# Patient Record
Sex: Male | Born: 1950 | Race: White | Hispanic: No | Marital: Married | Smoking: Former smoker
Health system: Southern US, Community
[De-identification: ages and names within clinical notes are randomized; demographics above are authoritative.]

## PROBLEM LIST (undated history)

## (undated) DIAGNOSIS — C679 Malignant neoplasm of bladder, unspecified: Secondary | ICD-10-CM

---

## 2013-06-13 DIAGNOSIS — K5100 Ulcerative (chronic) pancolitis without complications: Secondary | ICD-10-CM | POA: Diagnosis present

## 2018-11-02 DIAGNOSIS — C672 Malignant neoplasm of lateral wall of bladder: Secondary | ICD-10-CM

## 2018-11-29 HISTORY — PX: TRANSURETHRAL RESECTION OF BLADDER TUMOR: SHX2575

## 2018-12-29 ENCOUNTER — Other Ambulatory Visit: Payer: Self-pay

## 2018-12-29 ENCOUNTER — Inpatient Hospital Stay (HOSPITAL_COMMUNITY)
Admission: EM | Admit: 2018-12-29 | Discharge: 2019-01-01 | DRG: 683 | Disposition: A | Discharge status: Home / Self Care | Payer: Medicare Other | Attending: Internal Medicine | Admitting: Internal Medicine

## 2018-12-29 ENCOUNTER — Emergency Department (HOSPITAL_COMMUNITY): Payer: Medicare Other

## 2018-12-29 ENCOUNTER — Encounter (HOSPITAL_COMMUNITY): Payer: Self-pay | Admitting: Emergency Medicine

## 2018-12-29 DIAGNOSIS — N200 Calculus of kidney: Secondary | ICD-10-CM | POA: Diagnosis present

## 2018-12-29 DIAGNOSIS — N390 Urinary tract infection, site not specified: Secondary | ICD-10-CM | POA: Diagnosis present

## 2018-12-29 DIAGNOSIS — N179 Acute kidney failure, unspecified: Principal | ICD-10-CM | POA: Diagnosis present

## 2018-12-29 DIAGNOSIS — Z880 Allergy status to penicillin: Secondary | ICD-10-CM

## 2018-12-29 DIAGNOSIS — Z8371 Family history of colonic polyps: Secondary | ICD-10-CM

## 2018-12-29 DIAGNOSIS — K5100 Ulcerative (chronic) pancolitis without complications: Secondary | ICD-10-CM | POA: Diagnosis present

## 2018-12-29 DIAGNOSIS — R109 Unspecified abdominal pain: Secondary | ICD-10-CM | POA: Diagnosis not present

## 2018-12-29 DIAGNOSIS — Z20828 Contact with and (suspected) exposure to other viral communicable diseases: Secondary | ICD-10-CM | POA: Diagnosis present

## 2018-12-29 DIAGNOSIS — K219 Gastro-esophageal reflux disease without esophagitis: Secondary | ICD-10-CM | POA: Diagnosis present

## 2018-12-29 DIAGNOSIS — R339 Retention of urine, unspecified: Secondary | ICD-10-CM

## 2018-12-29 DIAGNOSIS — N401 Enlarged prostate with lower urinary tract symptoms: Secondary | ICD-10-CM | POA: Diagnosis present

## 2018-12-29 DIAGNOSIS — K8020 Calculus of gallbladder without cholecystitis without obstruction: Secondary | ICD-10-CM | POA: Diagnosis present

## 2018-12-29 DIAGNOSIS — Z87891 Personal history of nicotine dependence: Secondary | ICD-10-CM

## 2018-12-29 DIAGNOSIS — C672 Malignant neoplasm of lateral wall of bladder: Secondary | ICD-10-CM | POA: Diagnosis not present

## 2018-12-29 DIAGNOSIS — N138 Other obstructive and reflux uropathy: Secondary | ICD-10-CM | POA: Diagnosis present

## 2018-12-29 DIAGNOSIS — R338 Other retention of urine: Secondary | ICD-10-CM

## 2018-12-29 HISTORY — DX: Malignant neoplasm of bladder, unspecified: C67.9

## 2018-12-29 LAB — COMPREHENSIVE METABOLIC PANEL
ALT: 34 U/L (ref 0–44)
AST: 24 U/L (ref 15–41)
Albumin: 3.1 g/dL — ABNORMAL LOW (ref 3.5–5.0)
Alkaline Phosphatase: 62 U/L (ref 38–126)
Anion gap: 16 — ABNORMAL HIGH (ref 5–15)
BUN: 24 mg/dL — ABNORMAL HIGH (ref 8–23)
CO2: 21 mmol/L — ABNORMAL LOW (ref 22–32)
Calcium: 8.5 mg/dL — ABNORMAL LOW (ref 8.9–10.3)
Chloride: 93 mmol/L — ABNORMAL LOW (ref 98–111)
Creatinine, Ser: 2.99 mg/dL — ABNORMAL HIGH (ref 0.61–1.24)
GFR calc Af Amer: 24 mL/min — ABNORMAL LOW (ref >60)
GFR calc non Af Amer: 20 mL/min — ABNORMAL LOW (ref >60)
Glucose, Bld: 134 mg/dL — ABNORMAL HIGH (ref 70–99)
Potassium: 3.5 mmol/L (ref 3.5–5.1)
Sodium: 130 mmol/L — ABNORMAL LOW (ref 135–145)
Total Bilirubin: 0.7 mg/dL (ref 0.3–1.2)
Total Protein: 7.3 g/dL (ref 6.5–8.1)

## 2018-12-29 LAB — CBC
HCT: 37.3 % — ABNORMAL LOW (ref 39.0–52.0)
Hemoglobin: 12.7 g/dL — ABNORMAL LOW (ref 13.0–17.0)
MCH: 30.5 pg (ref 26.0–34.0)
MCHC: 34 g/dL (ref 30.0–36.0)
MCV: 89.7 fL (ref 80.0–100.0)
Platelets: 324 10*3/uL (ref 150–400)
RBC: 4.16 MIL/uL — ABNORMAL LOW (ref 4.22–5.81)
RDW: 12.9 % (ref 11.5–15.5)
WBC: 19.2 10*3/uL — ABNORMAL HIGH (ref 4.0–10.5)
nRBC: 0 % (ref 0.0–0.2)

## 2018-12-29 LAB — URINALYSIS, ROUTINE W REFLEX MICROSCOPIC
Bilirubin Urine: NEGATIVE
Glucose, UA: NEGATIVE mg/dL
Ketones, ur: NEGATIVE mg/dL
Nitrite: NEGATIVE
Protein, ur: NEGATIVE mg/dL
Specific Gravity, Urine: 1.005 (ref 1.005–1.030)
pH: 5 (ref 5.0–8.0)

## 2018-12-29 LAB — LACTIC ACID, PLASMA
Lactic Acid, Venous: 1.3 mmol/L (ref 0.5–1.9)
Lactic Acid, Venous: 1.1 mmol/L (ref 0.5–1.9)

## 2018-12-29 LAB — SARS CORONAVIRUS 2 BY RT PCR (HOSPITAL ORDER, PERFORMED IN ~~LOC~~ HOSPITAL LAB): SARS Coronavirus 2: NEGATIVE

## 2018-12-29 LAB — LIPASE, BLOOD: Lipase: 39 U/L (ref 11–51)

## 2018-12-29 MED ORDER — ONDANSETRON HCL 4 MG/2ML IJ SOLN: 4.0000 mg | Freq: Four times a day (QID) | INTRAMUSCULAR | Status: DC | PRN

## 2018-12-29 MED ORDER — FENTANYL CITRATE (PF) 100 MCG/2ML IJ SOLN
50.0000 ug | Freq: Once | INTRAMUSCULAR | Status: AC
  Administered 2018-12-29: 50 ug via INTRAVENOUS
  Filled 2018-12-29: qty 2

## 2018-12-29 MED ORDER — ACETAMINOPHEN 650 MG RE SUPP: 650.0000 mg | Freq: Four times a day (QID) | RECTAL | Status: DC | PRN

## 2018-12-29 MED ORDER — SODIUM CHLORIDE 0.9 % IV BOLUS
500.0000 mL | Freq: Once | INTRAVENOUS | Status: AC
  Administered 2018-12-29: 500 mL via INTRAVENOUS

## 2018-12-29 MED ORDER — TAMSULOSIN HCL 0.4 MG PO CAPS
0.8000 mg | ORAL_CAPSULE | Freq: Every day | ORAL | Status: DC
  Administered 2018-12-29 – 2019-01-01 (×4): 0.8 mg via ORAL
  Filled 2018-12-29 (×4): qty 2

## 2018-12-29 MED ORDER — SODIUM CHLORIDE 0.9% FLUSH: 3.0000 mL | Freq: Once | INTRAVENOUS | Status: DC

## 2018-12-29 MED ORDER — ONDANSETRON HCL 4 MG/2ML IJ SOLN
4.0000 mg | Freq: Once | INTRAMUSCULAR | Status: AC
  Administered 2018-12-29: 4 mg via INTRAVENOUS
  Filled 2018-12-29: qty 2

## 2018-12-29 MED ORDER — ACETAMINOPHEN 325 MG PO TABS
650.0000 mg | ORAL_TABLET | Freq: Four times a day (QID) | ORAL | Status: DC | PRN
  Administered 2018-12-30: 06:00:00 650 mg via ORAL
  Filled 2018-12-29: qty 2

## 2018-12-29 MED ORDER — ONDANSETRON HCL 4 MG PO TABS: 4.0000 mg | ORAL_TABLET | Freq: Four times a day (QID) | ORAL | Status: DC | PRN

## 2018-12-29 MED ORDER — SULFAMETHOXAZOLE-TRIMETHOPRIM 400-80 MG PO TABS
1.0000 | ORAL_TABLET | Freq: Two times a day (BID) | ORAL | Status: DC
  Administered 2018-12-30: 1 via ORAL
  Filled 2018-12-29 (×3): qty 1

## 2018-12-29 MED ORDER — HEPARIN SODIUM (PORCINE) 5000 UNIT/ML IJ SOLN
5000.0000 [IU] | Freq: Three times a day (TID) | INTRAMUSCULAR | Status: DC
  Administered 2018-12-29 – 2018-12-31 (×6): 5000 [IU] via SUBCUTANEOUS
  Filled 2018-12-29 (×8): qty 1

## 2018-12-29 NOTE — ED Provider Notes (Signed)
[facility] EMERGENCY DEPARTMENT Provider Note   CSN: [id number] Arrival date & time: 12/29/18  1021    History   Chief Complaint Chief Complaint  Patient presents with  . Abdominal Pain    HPI Timothy Fitzpatrick is a 68 y.o. male.     Patient is a 68 year old male with a history of ulcerative colitis and recent diagnosis of bladder cancer.  He is currently on Valstar and is followed by Dr. Rosana Hoes with [facility] urology/oncology at the office here in [place].  He states over the last week he has had worsening pain to his abdomen.  He has had some difficulty getting his urine out and at times it only dribbles.  He has had some increasing lower abdominal pain.  He was recently treated for a UTI and currently is still on Bactrim.  He reports he has not been able to eat or drink much and has had several episodes of vomiting.  He reports diffuse abdominal pain although it is mostly in the lower region.  He has had some fevers up to 103.  He has had poor appetite and poor oral intake.  He has had some diarrhea although he took Lomotil and then became a little bit constipated.  He denies any blood in his urine or stool.     Past Medical History:  Diagnosis Date  . Bladder cancer ([place])     There are no active problems to display for this patient.   History reviewed. No pertinent surgical history.      Home Medications    Prior to Admission medications   Not on File    Family History No family history on file.  Social History Social History   Tobacco Use  . Smoking status: Former Smoker  Substance Use Topics  . Alcohol use: Never    Frequency: Never  . Drug use: Never     Allergies   Penicillins   Review of Systems Review of Systems  Constitutional: Positive for appetite change, fatigue and fever. Negative for chills and diaphoresis.  HENT: Negative for congestion, rhinorrhea and sneezing.   Eyes: Negative.   Respiratory: Negative for cough,  chest tightness and shortness of breath.   Cardiovascular: Negative for chest pain and leg swelling.  Gastrointestinal: Positive for abdominal pain, constipation, diarrhea, nausea and vomiting. Negative for blood in stool.  Genitourinary: Positive for difficulty urinating. Negative for flank pain, frequency and hematuria.  Musculoskeletal: Negative for arthralgias and back pain.  Skin: Negative for rash.  Neurological: Negative for dizziness, speech difficulty, weakness, numbness and headaches.     Physical Exam Updated Vital Signs BP 125/72   Pulse 74   Temp 98.5 F (36.9 C) (Oral)   Resp 20   Ht 5\' 11"  (1.803 m)   Wt 93 kg   SpO2 96%   BMI 28.59 kg/m   Physical Exam Constitutional:      Appearance: He is well-developed.  HENT:     Head: Normocephalic and atraumatic.  Eyes:     Pupils: Pupils are equal, round, and reactive to light.  Neck:     Musculoskeletal: Normal range of motion and neck supple.  Cardiovascular:     Rate and Rhythm: Normal rate and regular rhythm.     Heart sounds: Normal heart sounds.  Pulmonary:     Effort: Pulmonary effort is normal. No respiratory distress.     Breath sounds: Normal breath sounds. No wheezing or rales.  Chest:  Chest wall: No tenderness.  Abdominal:     General: Bowel sounds are normal.     Palpations: Abdomen is soft.     Tenderness: There is generalized abdominal tenderness (Patient has generalized tenderness although it is more pronounced across the lower abdomen). There is no guarding or rebound.  Genitourinary:    Comments: Normal external male genitalia.  No testicular tenderness. Musculoskeletal: Normal range of motion.  Lymphadenopathy:     Cervical: No cervical adenopathy.  Skin:    General: Skin is warm and dry.     Findings: No rash.  Neurological:     Mental Status: He is alert and oriented to person, place, and time.      ED Treatments / Results  Labs (all labs ordered are listed, but only abnormal  results are displayed) Labs Reviewed  COMPREHENSIVE METABOLIC PANEL - Abnormal; Notable for the following components:      Result Value   Sodium 130 (*)    Chloride 93 (*)    CO2 21 (*)    Glucose, Bld 134 (*)    BUN 24 (*)    Creatinine, Ser 2.99 (*)    Calcium 8.5 (*)    Albumin 3.1 (*)    GFR calc non Af Amer 20 (*)    GFR calc Af Amer 24 (*)    Anion gap 16 (*)    All other components within normal limits  CBC - Abnormal; Notable for the following components:   WBC 19.2 (*)    RBC 4.16 (*)    Hemoglobin 12.7 (*)    HCT 37.3 (*)    All other components within normal limits  URINALYSIS, ROUTINE W REFLEX MICROSCOPIC - Abnormal; Notable for the following components:   Color, Urine STRAW (*)    Hgb urine dipstick MODERATE (*)    Leukocytes,Ua TRACE (*)    Bacteria, UA RARE (*)    All other components within normal limits  SARS CORONAVIRUS 2 (HOSPITAL ORDER, [facility])  URINE CULTURE  CULTURE, BLOOD (ROUTINE X 2)  CULTURE, BLOOD (ROUTINE X 2)  LIPASE, BLOOD  LACTIC ACID, PLASMA  LACTIC ACID, PLASMA    EKG None  Radiology Ct Abdomen Pelvis Wo Contrast  Result Date: 12/29/2018 CLINICAL DATA:  Generalized abdominal pain, currently receiving treatment for bladder cancer. Worsening nausea, vomiting and diarrhea. EXAM: CT ABDOMEN AND PELVIS WITHOUT CONTRAST TECHNIQUE: Multidetector CT imaging of the abdomen and pelvis was performed following the standard protocol without IV contrast. COMPARISON:  CT July 29, 2018 FINDINGS: Lower chest: Bandlike areas of scarring and/or atelectasis. Lung bases are otherwise clear. Normal heart size. No pericardial effusion. Hepatobiliary: No focal liver abnormality is seen. Stable appearance of the 1 cm gallstone near the gallbladder neck. No gallbladder wall thickening or biliary dilatation. Pancreas: Unremarkable. No pancreatic ductal dilatation or surrounding inflammatory changes. Spleen: Normal in size without focal  abnormality. Adrenals/Urinary Tract: Normal adrenals. Asymmetrically enlarged appearance of the right kidney with increased right perinephric stranding. Several nonobstructing calculi are present in the right renal collecting system. No ureteral calculi. The left kidney has a more normal appearance. Left ureter is unremarkable. The circumferentially thickened urinary bladder is largely decompressed over an inflated Foley catheter balloon with foci of gas within the lumen likely related to this instrumentation. Stomach/Bowel: Distal esophagus, stomach and duodenal sweep are unremarkable. No bowel wall thickening or dilatation. No evidence of obstruction. A normal appendix is visualized. Vascular/Lymphatic: Atherosclerotic plaque within the normal caliber aorta. Greater  than expected number of upper mid mesenteric lymph nodes in a surrounding area of hazy mesenteric stranding compatible with a ?misty mesentery? similar to prior. No suspicious or enlarged lymph nodes in the included lymphatic chains. Reproductive: Mild prostatomegaly. Coarse eccentric calcification of the prostate. No concerning abnormalities of the prostate or seminal vesicles. Other: No abdominopelvic free fluid or free gas. No bowel containing hernias. Bilateral fat containing inguinal hernias. Musculoskeletal: No acute or significant osseous findings. Multilevel degenerative changes are present in the imaged portions of the spine. IMPRESSION: 1. Asymmetrically enlarged appearance of the right kidney with increased right perinephric stranding, concerning for pyelonephritis. Recommend correlation with urinalysis. 2. Several nonobstructing calculi in the right renal collecting system. 3. Circumferentially thickened bladder wall decompressed by Foley catheter. Thickening may be treatment related the exclusion of urinary tract infection is recommended. 4. Greater than expected number of upper mid mesenteric lymph nodes in a surrounding area of hazy  mesenteric stranding compatible with mesenteritis, similar to prior. 5. Cholelithiasis without evidence of acute cholecystitis. 6. Mild prostatomegaly. 7. Aortic Atherosclerosis (ICD10-I70.0). Electronically Signed   By: Lovena Le M.D.   On: 12/29/2018 18:03    Procedures Procedures (including critical care time)  Medications Ordered in ED Medications  sodium chloride flush (NS) 0.9 % injection 3 mL (0 mLs Intravenous Hold 12/29/18 1444)  sodium chloride 0.9 % bolus 500 mL (0 mLs Intravenous Stopped 12/29/18 1300)  fentaNYL (SUBLIMAZE) injection 50 mcg (50 mcg Intravenous Given 12/29/18 1231)  ondansetron (ZOFRAN) injection 4 mg (4 mg Intravenous Given 12/29/18 1231)  sodium chloride 0.9 % bolus 500 mL (0 mLs Intravenous Stopped 12/29/18 1430)  fentaNYL (SUBLIMAZE) injection 50 mcg (50 mcg Intravenous Given 12/29/18 1546)     Initial Impression / Assessment and Plan / ED Course  I have reviewed the triage vital signs and the nursing notes.  Pertinent labs & imaging results that were available during my care of the patient were reviewed by me and considered in my medical decision making (see chart for details).        Patient is a 68 year old male who presents with abdominal pain associated fever and vomiting.  On bladder scan he had 750 cc.  A Foley catheter was placed and got a liter and a half drained out.  His abdominal pain improved but given his elevated white count, fever and ongoing pain, CT scan was performed which shows some bladder thickening and also some stranding around the right kidney.  His urine does not really appear to be consistent with infection.  It was sent with for culture.  He does have evidence of acute kidney injury on labs with a creatinine of 2.99.  He had a previously normal creatinine in June.  He was given some IV fluids.  There is no suggestions of sepsis.  I spoke to Dr. Posey Pronto with the hospitalist service to admit the patient for unassigned medicine.  Final  Clinical Impressions(s) / ED Diagnoses   Final diagnoses:  AKI (acute kidney injury) [facility])  Urinary retention    ED Discharge Orders    None       Malvin Johns, MD 12/29/18 [phone]

## 2018-12-29 NOTE — H&P (Signed)
History and Physical    Timothy Fitzpatrick PZW:[id number] DOB: Sep 30, 1950 DOA: 12/29/2018  PCP: [name]., MD  Patient coming from: Home  I have personally briefly reviewed patient's old medical records in [place]  Chief Complaint: Abdominal distention and pain, decreased urine output  HPI: Timothy Fitzpatrick is a 68 y.o. male with medical history significant for bladder cancer, ulcerative pancolitis, and GERD who presents to the ED for evaluation of 1 day decreased urinary output, abdominal bloating and discomfort, intermittent fevers, and decreased oral intake.  Patient was recently found to have Enterobacter aerogenes UTI and started on a 7-day course of Bactrim by his urologist which he began on 12/26/2018.  He was feeling well until the above symptoms began 1 day prior to admission.  He states he has been taking Flomax with adequate urine output until the last 24 hours when he is just had dribbling.  He has not had burning or pain with urination.  ED Course:  Initial vitals showed BP 167/80, pulse 25, RR 20, temp 98.5 Fahrenheit, SPO2 96% on room air.  Labs notable for WBC 19.2, hemoglobin 12.7, platelets 324,000, sodium 130, potassium 3.5, bicarb 21, BUN 24, creatinine 2.99, lipase 39, lactic acid 1.3.  Urinalysis showed negative nitrites, trace leukocytes, 0-5 RBC/hpf, 0-5 WBC/hpf, rare bacteria on microscopy.  Urine culture was obtained and pending.  Blood cultures were also obtained and pending.  SARS-CoV-2 test is negative.  Foley catheter was placed with reported output of 1.5 L and significant symptomatic improvement.  CT abdomen/pelvis without contrast showed an asymmetrically enlarged appearance of the right kidney with increased right perinephric stranding, several nonobstructing calculi in the right renal collecting system, thickened bladder wall decompressed by Foley catheter, cholelithiasis without evidence of acute cholecystitis, and mild prostatomegaly.  Patient was  given 500 cc normal saline x2 and the hospitalist service was consulted for further evaluation and management.   Review of Systems: All systems reviewed and are negative except as documented in history of present illness above.   Past Medical History:  Diagnosis Date   Bladder cancer [facility])     Past Surgical History:  Procedure Laterality Date   TRANSURETHRAL RESECTION OF BLADDER TUMOR  11/29/2018    Social History:  reports that he has quit smoking. He does not have any smokeless tobacco history on file. He reports that he does not drink alcohol or use drugs.  Allergies  Allergen Reactions   Penicillins     Family History  Problem Relation Age of Onset   Colon polyps Mother      Prior to Admission medications   Not on File    Physical Exam: Vitals:   12/29/18 1700 12/29/18 1715 12/29/18 1730 12/29/18 1818  BP: 111/69 113/66 125/72   Pulse: 67 71 74   Resp:      Temp:      TempSrc:      SpO2: 96% 96% 96%   Weight:    93 kg  Height:    5\' 11"  (1.803 m)    Constitutional: Resting in bed, NAD, calm, comfortable Eyes: PERRL, lids and conjunctivae normal ENMT: Mucous membranes are moist. Posterior pharynx clear of any exudate or lesions.Normal dentition.  Neck: normal, supple, no masses. Respiratory: clear to auscultation bilaterally, no wheezing, no crackles. Normal respiratory effort. No accessory muscle use.  Cardiovascular: Regular rate and rhythm, no murmurs / rubs / gallops. No extremity edema. 2+ pedal pulses. Abdomen: no tenderness, no masses palpated. No hepatosplenomegaly. Bowel sounds positive.  GU: Foley catheter in place Musculoskeletal: no clubbing / cyanosis. No joint deformity upper and lower extremities. Good ROM, no contractures. Normal muscle tone.  Skin: no rashes, lesions, ulcers. No induration Neurologic: CN 2-12 grossly intact. Sensation intact, Strength 5/5 in all 4.  Psychiatric: Normal judgment and insight. Alert and oriented x 3.  Normal mood.     Labs on Admission: I have personally reviewed following labs and imaging studies  CBC: Recent Labs  Lab 12/29/18 1053  WBC 19.2*  HGB 12.7*  HCT 37.3*  MCV 89.7  PLT 604   Basic Metabolic Panel: Recent Labs  Lab 12/29/18 1053  NA 130*  K 3.5  CL 93*  CO2 21*  GLUCOSE 134*  BUN 24*  CREATININE 2.99*  CALCIUM 8.5*   GFR: Estimated Creatinine Clearance: 27.2 mL/min (A) (by C-G formula based on SCr of 2.99 mg/dL (H)). Liver Function Tests: Recent Labs  Lab 12/29/18 1053  AST 24  ALT 34  ALKPHOS 62  BILITOT 0.7  PROT 7.3  ALBUMIN 3.1*   Recent Labs  Lab 12/29/18 1053  LIPASE 39   No results for input(s): AMMONIA in the last 168 hours. Coagulation Profile: No results for input(s): INR, PROTIME in the last 168 hours. Cardiac Enzymes: No results for input(s): CKTOTAL, CKMB, CKMBINDEX, TROPONINI in the last 168 hours. BNP (last 3 results) No results for input(s): PROBNP in the last 8760 hours. HbA1C: No results for input(s): HGBA1C in the last 72 hours. CBG: No results for input(s): GLUCAP in the last 168 hours. Lipid Profile: No results for input(s): CHOL, HDL, LDLCALC, TRIG, CHOLHDL, LDLDIRECT in the last 72 hours. Thyroid Function Tests: No results for input(s): TSH, T4TOTAL, FREET4, T3FREE, THYROIDAB in the last 72 hours. Anemia Panel: No results for input(s): VITAMINB12, FOLATE, FERRITIN, TIBC, IRON, RETICCTPCT in the last 72 hours. Urine analysis:    Component Value Date/Time   COLORURINE STRAW (A) 12/29/2018 1237   APPEARANCEUR CLEAR 12/29/2018 1237   LABSPEC 1.005 12/29/2018 1237   PHURINE 5.0 12/29/2018 1237   GLUCOSEU NEGATIVE 12/29/2018 1237   HGBUR MODERATE (A) 12/29/2018 1237   BILIRUBINUR NEGATIVE 12/29/2018 1237   KETONESUR NEGATIVE 12/29/2018 1237   PROTEINUR NEGATIVE 12/29/2018 1237   NITRITE NEGATIVE 12/29/2018 1237   LEUKOCYTESUR TRACE (A) 12/29/2018 1237    Radiological Exams on Admission: Ct Abdomen Pelvis  Wo Contrast  Result Date: 12/29/2018 CLINICAL DATA:  Generalized abdominal pain, currently receiving treatment for bladder cancer. Worsening nausea, vomiting and diarrhea. EXAM: CT ABDOMEN AND PELVIS WITHOUT CONTRAST TECHNIQUE: Multidetector CT imaging of the abdomen and pelvis was performed following the standard protocol without IV contrast. COMPARISON:  CT July 29, 2018 FINDINGS: Lower chest: Bandlike areas of scarring and/or atelectasis. Lung bases are otherwise clear. Normal heart size. No pericardial effusion. Hepatobiliary: No focal liver abnormality is seen. Stable appearance of the 1 cm gallstone near the gallbladder neck. No gallbladder wall thickening or biliary dilatation. Pancreas: Unremarkable. No pancreatic ductal dilatation or surrounding inflammatory changes. Spleen: Normal in size without focal abnormality. Adrenals/Urinary Tract: Normal adrenals. Asymmetrically enlarged appearance of the right kidney with increased right perinephric stranding. Several nonobstructing calculi are present in the right renal collecting system. No ureteral calculi. The left kidney has a more normal appearance. Left ureter is unremarkable. The circumferentially thickened urinary bladder is largely decompressed over an inflated Foley catheter balloon with foci of gas within the lumen likely related to this instrumentation. Stomach/Bowel: Distal esophagus, stomach and duodenal sweep are unremarkable. No bowel wall  thickening or dilatation. No evidence of obstruction. A normal appendix is visualized. Vascular/Lymphatic: Atherosclerotic plaque within the normal caliber aorta. Greater than expected number of upper mid mesenteric lymph nodes in a surrounding area of hazy mesenteric stranding compatible with a ?misty mesentery? similar to prior. No suspicious or enlarged lymph nodes in the included lymphatic chains. Reproductive: Mild prostatomegaly. Coarse eccentric calcification of the prostate. No concerning abnormalities  of the prostate or seminal vesicles. Other: No abdominopelvic free fluid or free gas. No bowel containing hernias. Bilateral fat containing inguinal hernias. Musculoskeletal: No acute or significant osseous findings. Multilevel degenerative changes are present in the imaged portions of the spine. IMPRESSION: 1. Asymmetrically enlarged appearance of the right kidney with increased right perinephric stranding, concerning for pyelonephritis. Recommend correlation with urinalysis. 2. Several nonobstructing calculi in the right renal collecting system. 3. Circumferentially thickened bladder wall decompressed by Foley catheter. Thickening may be treatment related the exclusion of urinary tract infection is recommended. 4. Greater than expected number of upper mid mesenteric lymph nodes in a surrounding area of hazy mesenteric stranding compatible with mesenteritis, similar to prior. 5. Cholelithiasis without evidence of acute cholecystitis. 6. Mild prostatomegaly. 7. Aortic Atherosclerosis (ICD10-I70.0). Electronically Signed   By: Lovena Le M.D.   On: 12/29/2018 18:03    EKG: Not performed.  Assessment/Plan Principal Problem:   Malignant neoplasm of lateral wall of urinary bladder (HCC) Active Problems:   AKI (acute kidney injury) ([place])   Ulcerative pancolitis (HCC)   Acute urinary retention  Timothy Fitzpatrick is a 68 y.o. male with medical history significant for bladder cancer, ulcerative pancolitis, and GERD who is admitted with AKI due to acute urinary retention.   Acute kidney injury due to acute urinary retention: Foley catheter placed in the ED with approximately 1.5 L output and significant improvement in symptoms.  Urinary retention possibly secondary to valrubicin therapy. -Continue Foley catheter, may need to keep in place on discharge and follow-up with urology -Increase tamsulosin to 0.4 mg daily -Repeat labs in a.m.  Enterobacter aerogenous UTI: Seen on urine culture collected  12/23/2018 at outside facility.  Has been on Bactrim beginning a 12/26/2018 which culture shows susceptibility to. -Continue oral Bactrim DS through 01/02/2019 -Follow-up repeat urine culture  Bladder cancer: Follows with urology in [place].  Underwent transurethral resection of bladder tumor on 11/29/2018.  Currently receiving valrubicin weekly infusions.  -Follow-up with urology outpatient  Ulcerative pancolitis: Follows with GI in [place].  Has been on infliximab infusions, however patient states this is on hold due to planned BCG treatment for bladder cancer.  Currently no evidence of acute flare.  Continue to monitor.   DVT prophylaxis: Subcutaneous heparin Code Status: Full code, confirmed with patient Family Communication: Discussed with patient, no family at bedside Disposition Plan: Likely discharge to home in 1-2 days Consults called: None Admission status: Observation   Zada Finders MD [facility]  If 7PM-7AM, please contact night-coverage [email or web address]  12/29/2018, 7:33 PM

## 2018-12-29 NOTE — ED Notes (Signed)
Patient transported to CT 

## 2018-12-29 NOTE — ED Triage Notes (Signed)
Pt is currently being treated for bladder cancer and for the last week and been dealing with abd pain n/v/d pt is currently on antibiotics for a UTI as well. Pt states he keeps feeling worse instead of better.

## 2018-12-30 ENCOUNTER — Encounter (HOSPITAL_COMMUNITY): Payer: Self-pay

## 2018-12-30 DIAGNOSIS — Z87891 Personal history of nicotine dependence: Secondary | ICD-10-CM | POA: Diagnosis not present

## 2018-12-30 DIAGNOSIS — N179 Acute kidney failure, unspecified: Secondary | ICD-10-CM | POA: Diagnosis present

## 2018-12-30 DIAGNOSIS — N390 Urinary tract infection, site not specified: Secondary | ICD-10-CM | POA: Diagnosis present

## 2018-12-30 DIAGNOSIS — N138 Other obstructive and reflux uropathy: Secondary | ICD-10-CM | POA: Diagnosis present

## 2018-12-30 DIAGNOSIS — K219 Gastro-esophageal reflux disease without esophagitis: Secondary | ICD-10-CM | POA: Diagnosis present

## 2018-12-30 DIAGNOSIS — Z20828 Contact with and (suspected) exposure to other viral communicable diseases: Secondary | ICD-10-CM | POA: Diagnosis present

## 2018-12-30 DIAGNOSIS — Z880 Allergy status to penicillin: Secondary | ICD-10-CM | POA: Diagnosis not present

## 2018-12-30 DIAGNOSIS — C672 Malignant neoplasm of lateral wall of bladder: Secondary | ICD-10-CM | POA: Diagnosis present

## 2018-12-30 DIAGNOSIS — R109 Unspecified abdominal pain: Secondary | ICD-10-CM | POA: Diagnosis present

## 2018-12-30 DIAGNOSIS — N401 Enlarged prostate with lower urinary tract symptoms: Secondary | ICD-10-CM | POA: Diagnosis present

## 2018-12-30 DIAGNOSIS — K8020 Calculus of gallbladder without cholecystitis without obstruction: Secondary | ICD-10-CM | POA: Diagnosis present

## 2018-12-30 DIAGNOSIS — N200 Calculus of kidney: Secondary | ICD-10-CM | POA: Diagnosis present

## 2018-12-30 DIAGNOSIS — Z8371 Family history of colonic polyps: Secondary | ICD-10-CM | POA: Diagnosis not present

## 2018-12-30 DIAGNOSIS — K5100 Ulcerative (chronic) pancolitis without complications: Secondary | ICD-10-CM | POA: Diagnosis present

## 2018-12-30 LAB — BASIC METABOLIC PANEL
Anion gap: 10 (ref 5–15)
BUN: 15 mg/dL (ref 8–23)
CO2: 26 mmol/L (ref 22–32)
Calcium: 8.3 mg/dL — ABNORMAL LOW (ref 8.9–10.3)
Chloride: 103 mmol/L (ref 98–111)
Creatinine, Ser: 1.21 mg/dL (ref 0.61–1.24)
GFR calc Af Amer: >60 mL/min (ref >60)
GFR calc non Af Amer: >60 mL/min (ref >60)
Glucose, Bld: 104 mg/dL — ABNORMAL HIGH (ref 70–99)
Potassium: 3.9 mmol/L (ref 3.5–5.1)
Sodium: 139 mmol/L (ref 135–145)

## 2018-12-30 LAB — CBC
HCT: 32.2 % — ABNORMAL LOW (ref 39.0–52.0)
Hemoglobin: 11 g/dL — ABNORMAL LOW (ref 13.0–17.0)
MCH: 31.2 pg (ref 26.0–34.0)
MCHC: 34.2 g/dL (ref 30.0–36.0)
MCV: 91.2 fL (ref 80.0–100.0)
Platelets: 290 10*3/uL (ref 150–400)
RBC: 3.53 MIL/uL — ABNORMAL LOW (ref 4.22–5.81)
RDW: 13.3 % (ref 11.5–15.5)
WBC: 12.4 10*3/uL — ABNORMAL HIGH (ref 4.0–10.5)
nRBC: 0 % (ref 0.0–0.2)

## 2018-12-30 LAB — URINE CULTURE: Culture: NO GROWTH

## 2018-12-30 LAB — HIV ANTIBODY (ROUTINE TESTING W REFLEX): HIV Screen 4th Generation wRfx: NONREACTIVE

## 2018-12-30 MED ORDER — FUROSEMIDE 40 MG PO TABS
40.0000 mg | ORAL_TABLET | Freq: Once | ORAL | Status: AC
  Administered 2018-12-30: 40 mg via ORAL
  Filled 2018-12-30: qty 1

## 2018-12-30 MED ORDER — MAGNESIUM CITRATE PO SOLN
1.0000 | Freq: Once | ORAL | Status: AC
  Administered 2018-12-30: 09:00:00 1 via ORAL
  Filled 2018-12-30: qty 296

## 2018-12-30 MED ORDER — SODIUM CHLORIDE 0.9 % IV BOLUS
1000.0000 mL | Freq: Once | INTRAVENOUS | Status: AC
  Administered 2018-12-30: 09:00:00 1000 mL via INTRAVENOUS

## 2018-12-30 MED ORDER — BACID PO TABS
2.0000 | ORAL_TABLET | Freq: Three times a day (TID) | ORAL | Status: DC
  Administered 2018-12-30 – 2019-01-01 (×7): 2 via ORAL
  Filled 2018-12-30 (×9): qty 2

## 2018-12-30 MED ORDER — SULFAMETHOXAZOLE-TRIMETHOPRIM 800-160 MG PO TABS
2.0000 | ORAL_TABLET | Freq: Two times a day (BID) | ORAL | Status: DC
  Administered 2018-12-30 – 2019-01-01 (×5): 2 via ORAL
  Filled 2018-12-30 (×5): qty 2

## 2018-12-30 MED ORDER — DOCUSATE SODIUM 100 MG PO CAPS
100.0000 mg | ORAL_CAPSULE | Freq: Two times a day (BID) | ORAL | Status: DC
  Administered 2018-12-30 – 2019-01-01 (×4): 100 mg via ORAL
  Filled 2018-12-30 (×5): qty 1

## 2018-12-30 MED ORDER — SENNOSIDES-DOCUSATE SODIUM 8.6-50 MG PO TABS: 1.0000 | ORAL_TABLET | Freq: Every evening | ORAL | Status: DC | PRN

## 2018-12-30 NOTE — Progress Notes (Signed)
PROGRESS NOTE    Patient: Timothy Fitzpatrick                            PCP: [name]., MD                    DOB: 06/12/1950            DOA: 12/29/2018 OBS:[id number]             DOS: 12/30/2018, 12:42 PM   LOS: 0 days   Date of Service: The patient was seen and examined on 12/30/2018  Subjective:   The patient was seen and examined this morning, stable no acute distress, Foley catheter in place, good urine output clear urine was noted in the bag. Patient stating he is feeling much better with the Foley catheter and noticing good urine output himself. Denies him having any fever chills or flank pain.   Brief Narrative:   Timothy Fitzpatrick is a 68 y.o. male with medical history significant for bladder cancer, ulcerative pancolitis, and GERD who presents to the ED for evaluation of 1 day decreased urinary output, abdominal bloating and discomfort, intermittent fevers, and decreased oral intake.  Patient was recently found to have Enterobacter aerogenes UTI and started on a 7-day course of Bactrim by his urologist which he began on 12/26/2018.  He was feeling well until the above symptoms began 1 day prior to admission.  He states he has been taking Flomax with adequate urine output until the last 24 hours when he is just had dribbling.  He has not had burning or pain with urination.  ED Course:  Initial vitals showed BP 167/80, pulse 25, RR 20, temp 98.5 Fahrenheit, SPO2 96% on room air.  Labs notable for WBC 19.2, hemoglobin 12.7, platelets 324,000, sodium 130, potassium 3.5, bicarb 21, BUN 24, creatinine 2.99, lipase 39, lactic acid 1.3.  Urinalysis showed negative nitrites, trace leukocytes, 0-5 RBC/hpf, 0-5 WBC/hpf, rare bacteria on microscopy.  Urine culture was obtained and pending.  Blood cultures were also obtained and pending.  SARS-CoV-2 test is negative.  Foley catheter was placed with reported output of 1.5 L and significant symptomatic improvement.  CT abdomen/pelvis  without contrast showed an asymmetrically enlarged appearance of the right kidney with increased right perinephric stranding, several nonobstructing calculi in the right renal collecting system, thickened bladder wall decompressed by Foley catheter, cholelithiasis without evidence of acute cholecystitis, and mild prostatomegaly.  Patient was given 500 cc normal saline x2 and the hospitalist service was consulted for further evaluation and management.   Assessment & Plan:   Principal Problem:   Malignant neoplasm of lateral wall of urinary bladder (HCC) Active Problems:   AKI (acute kidney injury) ([place])   Ulcerative pancolitis (HCC)   Acute urinary retention   Acute kidney injury due to acute urinary retention: -Foley catheter was placed in ED, immediately 1.5 L of urine output was noted, he continues to have good urine output.  His kidney function steadily improved.  Improved pain - Urinary retention possibly secondary to valrubicin therapy. -Anticipating patient to be discharged with Foley catheter -Increase tamsulosin to 0.4 mg daily -Much improved kidney function, creatinine 2.99 >>1.21 -Anticipating another liter of normal saline fluid resuscitation,  -Anticipating PRN Lasix if in case of any volume overload.  Enterobacter aerogenous UTI: Seen on urine culture collected 12/23/2018 at outside facility.  Has been on Bactrim beginning a 12/26/2018 which culture shows susceptibility to. -  Continue oral Bactrim DS through 01/02/2019 -Follow-up repeat urine culture -WBC 19.2 >> 12.4 today   Bladder cancer: Follows with urology in [place].  Underwent transurethral resection of bladder tumor on 11/29/2018.  Currently receiving valrubicin weekly infusions.  -Follow-up with urology outpatient  Ulcerative pancolitis: Follows with GI in [place].  Has been on infliximab infusions, however patient states this is on hold due to planned BCG treatment for bladder cancer.  Currently no  evidence of acute flare.  Continue to monitor.   DVT prophylaxis: Subcutaneous heparin Code Status: Full code, confirmed with patient Family Communication: Discussed with patient, no family at bedside Disposition Plan: Likely discharge to home in next 24 hours with Foley catheter and follow-up with [facility]. Consults called: None Admission status: Observation     Procedures:   No admission procedures for hospital encounter.     Antimicrobials:  Anti-infectives (From admission, onward)   Start     Dose/Rate Route Frequency Ordered Stop   12/30/18 1000  sulfamethoxazole-trimethoprim (BACTRIM DS) 800-160 MG per tablet 2 tablet     2 tablet Oral Every 12 hours 12/30/18 0846 01/03/19 0959   12/29/18 2200  sulfamethoxazole-trimethoprim (BACTRIM) 400-80 MG per tablet 1 tablet  Status:  Discontinued     1 tablet Oral Every 12 hours 12/29/18 2025 12/30/18 0846       Medication:  . docusate sodium  100 mg Oral BID  . heparin  5,000 Units Subcutaneous Q8H  . lactobacillus acidophilus  2 tablet Oral TID  . sodium chloride flush  3 mL Intravenous Once  . sulfamethoxazole-trimethoprim  2 tablet Oral Q12H  . tamsulosin  0.8 mg Oral Daily    acetaminophen **OR** acetaminophen, ondansetron **OR** ondansetron (ZOFRAN) IV, senna-docusate   Objective:   Vitals:   12/29/18 2345 12/30/18 0112 12/30/18 0120 12/30/18 0552  BP: 117/67 131/73  113/65  Pulse: 72 77  80  Resp: 19 18  17   Temp:  99.5 F (37.5 C)  99.8 F (37.7 C)  TempSrc:  Oral  Oral  SpO2: 96% 95%  94%  Weight:   91.4 kg   Height:        Intake/Output Summary (Last 24 hours) at 12/30/2018 1242 Last data filed at 12/30/2018 0601 Gross per 24 hour  Intake 1000 ml  Output 6075 ml  Net -5075 ml   Filed Weights   12/29/18 1818 12/30/18 0120  Weight: 93 kg 91.4 kg     Examination:   Physical Exam  Constitution:  Alert, cooperative, no distress,  Appears calm and comfortable  Psychiatric: Normal and  stable mood and affect, cognition intact,   HEENT: Normocephalic, PERRL, otherwise with in Normal limits  Chest:Chest symmetric Cardio vascular:  S1/S2, RRR, No murmure, No Rubs or Gallops  pulmonary: Clear to auscultation bilaterally, respirations unlabored, negative wheezes / crackles Abdomen: Soft, non-tender, non-distended, bowel sounds,no masses, no organomegaly Muscular skeletal: Limited exam - in bed, able to move all 4 extremities, Normal strength,  Neuro: CNII-XII intact. , normal motor and sensation, reflexes intact  Extremities: No pitting edema lower extremities, +2 pulses  Skin: Dry, warm to touch, negative for any Rashes, No open wounds Wounds: per nursing documentation Foley catheter in place   LABs:  CBC Latest Ref Rng & Units 12/30/2018 12/29/2018  WBC 4.0 - 10.5 K/uL 12.4(H) 19.2(H)  Hemoglobin 13.0 - 17.0 g/dL 11.0(L) 12.7(L)  Hematocrit 39.0 - 52.0 % 32.2(L) 37.3(L)  Platelets 150 - 400 K/uL 290 324   CMP Latest Ref Rng &  Units 12/30/2018 12/29/2018  Glucose 70 - 99 mg/dL 104(H) 134(H)  BUN 8 - 23 mg/dL 15 24(H)  Creatinine 0.61 - 1.24 mg/dL 1.21 2.99(H)  Sodium 135 - 145 mmol/L 139 130(L)  Potassium 3.5 - 5.1 mmol/L 3.9 3.5  Chloride 98 - 111 mmol/L 103 93(L)  CO2 22 - 32 mmol/L 26 21(L)  Calcium 8.9 - 10.3 mg/dL 8.3(L) 8.5(L)  Total Protein 6.5 - 8.1 g/dL - 7.3  Total Bilirubin 0.3 - 1.2 mg/dL - 0.7  Alkaline Phos 38 - 126 U/L - 62  AST 15 - 41 U/L - 24  ALT 0 - 44 U/L - 34    SIGNED: Deatra James, MD, FACP, FHM. [facility],  Pager [phone]  If 7PM-7AM, please contact night-coverage [email or web address] Ota [facility] 12/30/2018, 12:42 PM

## 2018-12-30 NOTE — Progress Notes (Signed)
Received pt from ED who is AOX4, skin intact with foley catheter,vitals signs stable. T: 99.5 F, BP: 131/73 (91), PR: 77, RR:18, Sp02: 95 RA. Oriented to room, bed controls and plan of care. Left lying comfortably in bed with call bell at reach. Will continue to monitor.

## 2018-12-30 NOTE — ED Notes (Signed)
Pt able to tolerate room air. SpO2 95%  Discontinued 2 L nasal cannula

## 2018-12-31 LAB — CBC
HCT: 30.5 % — ABNORMAL LOW (ref 39.0–52.0)
Hemoglobin: 10.3 g/dL — ABNORMAL LOW (ref 13.0–17.0)
MCH: 31.1 pg (ref 26.0–34.0)
MCHC: 33.8 g/dL (ref 30.0–36.0)
MCV: 92.1 fL (ref 80.0–100.0)
Platelets: 320 10*3/uL (ref 150–400)
RBC: 3.31 MIL/uL — ABNORMAL LOW (ref 4.22–5.81)
RDW: 13.2 % (ref 11.5–15.5)
WBC: 13.6 10*3/uL — ABNORMAL HIGH (ref 4.0–10.5)
nRBC: 0 % (ref 0.0–0.2)

## 2018-12-31 LAB — BASIC METABOLIC PANEL
Anion gap: 13 (ref 5–15)
BUN: 12 mg/dL (ref 8–23)
CO2: 24 mmol/L (ref 22–32)
Calcium: 8.4 mg/dL — ABNORMAL LOW (ref 8.9–10.3)
Chloride: 100 mmol/L (ref 98–111)
Creatinine, Ser: 0.93 mg/dL (ref 0.61–1.24)
GFR calc Af Amer: >60 mL/min (ref >60)
GFR calc non Af Amer: >60 mL/min (ref >60)
Glucose, Bld: 89 mg/dL (ref 70–99)
Potassium: 3.7 mmol/L (ref 3.5–5.1)
Sodium: 137 mmol/L (ref 135–145)

## 2018-12-31 NOTE — Plan of Care (Signed)

## 2018-12-31 NOTE — Progress Notes (Signed)
PROGRESS NOTE    Patient: Timothy Fitzpatrick                            PCP: Timothy Mina., MD                    DOB: November 12, 1950            DOA: 12/29/2018 KPT:[id number]             DOS: 12/31/2018, 12:49 PM   LOS: 1 day   Date of Service: The patient was seen and examined on 12/31/2018  Subjective:   Patient continues to have issues with Foley catheter overnight, indicates the catheter became somewhat painful, questionably re-obstructed although after repositioning and leg strap was placed output improved early this morning with improvement in pain.  Otherwise declines chest pain, shortness breath, nausea, vomiting, diarrhea, constipation, headache, fevers, chills.   Brief Narrative:   Timothy Fitzpatrick is a 68 y.o. male with medical history significant for bladder cancer, ulcerative pancolitis, and GERD who presents to the ED for evaluation of 1 day decreased urinary output, abdominal bloating and discomfort, intermittent fevers, and decreased oral intake.  Patient was recently found to have Enterobacter aerogenes UTI and started on a 7-day course of Bactrim by his urologist which he began on 12/26/2018.  He was feeling well until the above symptoms began 1 day prior to admission.  He states he has been taking Flomax with adequate urine output until the last 24 hours when he is just had dribbling.  He has not had burning or pain with urination.  In the ED initial vitals showed BP 167/80, pulse 25, RR 20, temp 98.5 Fahrenheit, SPO2 96% on room air. Labs notable for WBC 19.2, hemoglobin 12.7, platelets 324,000, sodium 130, potassium 3.5, bicarb 21, BUN 24, creatinine 2.99, lipase 39, lactic acid 1.3. Urinalysis showed negative nitrites, trace leukocytes, 0-5 RBC/hpf, 0-5 WBC/hpf, rare bacteria on microscopy.  Urine culture was obtained and pending.  Blood cultures were also obtained and pending. SARS-CoV-2 test is negative. Foley catheter was placed with reported output of 1.5 L and significant symptomatic  improvement. CT abdomen/pelvis without contrast showed an asymmetrically enlarged appearance of the right kidney with increased right perinephric stranding, several nonobstructing calculi in the right renal collecting system, thickened bladder wall decompressed by Foley catheter, cholelithiasis without evidence of acute cholecystitis, and mild prostatomegaly. Patient was given 500 cc normal saline x2 and the hospitalist service was consulted for further evaluation and management.   Assessment & Plan:   Principal Problem:   Malignant neoplasm of lateral wall of urinary bladder (HCC) Active Problems:   AKI (acute kidney injury) ([place])   Ulcerative pancolitis (HCC)   Acute urinary retention   Acute kidney injury due to acute urinary retention, improving: -Foley catheter was placed in ED, immediately 1.5 L of urine output was noted, he continues to have good urine output.  His kidney function steadily improved.  Improved pain - Urinary retention possibly secondary to valrubicin therapy versus obstructive uropathy. -Patient likely to be discharged with Foley catheter -Increase tamsulosin to 0.4 mg daily -Much improved kidney function, creatinine 2.99 >>1.21>>0.93  Enterobacter aerogenous UTI, POA, ongoing: Seen on urine culture collected 12/23/2018 at outside facility.  Has been on Bactrim beginning a 12/26/2018 which culture shows susceptibility to. -Continue oral Bactrim DS through 01/02/2019 -Follow-up repeat urine culture -currently no growth to date -WBC 19.2 >> 12.4 >>13.6 today  Malignant neoplasm of lateral wall of urinary bladder Follows with urology with Dr. Nila Fitzpatrick in [place], recently sent to [facility] for specialized treatment as below Underwent transurethral resection of bladder tumor on 11/29/2018.  Currently receiving valrubicin weekly infusions.  Follow-up with urology outpatient  Ulcerative pancolitis: Follows with GI in [place].  Has been on infliximab infusions,  however patient states this is on hold due to planned BCG treatment for bladder cancer.   Currently no evidence of acute flare.  Continue to monitor.   DVT prophylaxis: Subcutaneous heparin Code Status: Full code, confirmed with patient Family Communication: Discussed with patient, no family at bedside Disposition Plan:  Tentative disposition in the next 24 to 48 hours with Foley catheter pending clinical course, close follow-up with Dr. Nila Fitzpatrick as well as [facility] Consults called: None Admission status:  Inpatient  Antimicrobials:  Anti-infectives (From admission, onward)   Start     Dose/Rate Route Frequency Ordered Stop   12/30/18 1000  sulfamethoxazole-trimethoprim (BACTRIM DS) 800-160 MG per tablet 2 tablet     2 tablet Oral Every 12 hours 12/30/18 0846 01/03/19 0959   12/29/18 2200  sulfamethoxazole-trimethoprim (BACTRIM) 400-80 MG per tablet 1 tablet  Status:  Discontinued     1 tablet Oral Every 12 hours 12/29/18 2025 12/30/18 0846       Medication:   docusate sodium  100 mg Oral BID   heparin  5,000 Units Subcutaneous Q8H   lactobacillus acidophilus  2 tablet Oral TID   sodium chloride flush  3 mL Intravenous Once   sulfamethoxazole-trimethoprim  2 tablet Oral Q12H   tamsulosin  0.8 mg Oral Daily    acetaminophen **OR** acetaminophen, ondansetron **OR** ondansetron (ZOFRAN) IV, senna-docusate   Objective:   Vitals:   12/30/18 1500 12/30/18 2113 12/31/18 0440 12/31/18 0500  BP: 119/65 125/83 123/68   Pulse: 89 79 68   Resp:   18   Temp: 99.1 F (37.3 C) 99.9 F (37.7 C) 99.1 F (37.3 C)   TempSrc: Oral Oral Oral   SpO2: 94% 98% 94%   Weight:    91.4 kg  Height:        Intake/Output Summary (Last 24 hours) at 12/31/2018 1249 Last data filed at 12/31/2018 1108 Gross per 24 hour  Intake 680 ml  Output 3550 ml  Net -2870 ml   Filed Weights   12/29/18 1818 12/30/18 0120 12/31/18 0500  Weight: 93 kg 91.4 kg 91.4 kg     Examination:    Physical Exam  Constitution:  Alert, cooperative, no distress,  Appears calm and comfortable  Psychiatric: Normal and stable mood and affect, cognition intact,   HEENT: Normocephalic, PERRL, otherwise with in Normal limits  Chest:Chest symmetric Cardio vascular:  S1/S2, RRR, No murmure, No Rubs or Gallops  pulmonary: Clear to auscultation bilaterally, respirations unlabored, negative wheezes / crackles Abdomen: Soft, non-tender, non-distended, bowel sounds,no masses, no organomegaly Muscular skeletal: Limited exam - in bed, able to move all 4 extremities, Normal strength,  Neuro: CNII-XII intact. , normal motor and sensation, reflexes intact  Extremities: No pitting edema lower extremities, +2 pulses  Skin: Dry, warm to touch, negative for any Rashes, No open wounds GU: Foley catheter in place   LABs:  CBC Latest Ref Rng & Units 12/31/2018 12/30/2018 12/29/2018  WBC 4.0 - 10.5 K/uL 13.6(H) 12.4(H) 19.2(H)  Hemoglobin 13.0 - 17.0 g/dL 10.3(L) 11.0(L) 12.7(L)  Hematocrit 39.0 - 52.0 % 30.5(L) 32.2(L) 37.3(L)  Platelets 150 - 400 K/uL 320 290 324  CMP Latest Ref Rng & Units 12/31/2018 12/30/2018 12/29/2018  Glucose 70 - 99 mg/dL 89 104(H) 134(H)  BUN 8 - 23 mg/dL 12 15 24(H)  Creatinine 0.61 - 1.24 mg/dL 0.93 1.21 2.99(H)  Sodium 135 - 145 mmol/L 137 139 130(L)  Potassium 3.5 - 5.1 mmol/L 3.7 3.9 3.5  Chloride 98 - 111 mmol/L 100 103 93(L)  CO2 22 - 32 mmol/L 24 26 21(L)  Calcium 8.9 - 10.3 mg/dL 8.4(L) 8.3(L) 8.5(L)  Total Protein 6.5 - 8.1 g/dL - - 7.3  Total Bilirubin 0.3 - 1.2 mg/dL - - 0.7  Alkaline Phos 38 - 126 U/L - - 62  AST 15 - 41 U/L - - 24  ALT 0 - 44 U/L - - 34    SIGNED: Little Ishikawa, DO [facility],  Pager [phone]  If 7PM-7AM, please contact night-coverage [email or web address] [email or web address] [facility] 12/31/2018, 12:49 PM

## 2019-01-01 LAB — BASIC METABOLIC PANEL
Anion gap: 12 (ref 5–15)
BUN: 11 mg/dL (ref 8–23)
CO2: 24 mmol/L (ref 22–32)
Calcium: 8.6 mg/dL — ABNORMAL LOW (ref 8.9–10.3)
Chloride: 102 mmol/L (ref 98–111)
Creatinine, Ser: 0.93 mg/dL (ref 0.61–1.24)
GFR calc Af Amer: >60 mL/min (ref >60)
GFR calc non Af Amer: >60 mL/min (ref >60)
Glucose, Bld: 90 mg/dL (ref 70–99)
Potassium: 4 mmol/L (ref 3.5–5.1)
Sodium: 138 mmol/L (ref 135–145)

## 2019-01-01 LAB — CBC
HCT: 34.2 % — ABNORMAL LOW (ref 39.0–52.0)
Hemoglobin: 11.3 g/dL — ABNORMAL LOW (ref 13.0–17.0)
MCH: 30.6 pg (ref 26.0–34.0)
MCHC: 33 g/dL (ref 30.0–36.0)
MCV: 92.7 fL (ref 80.0–100.0)
Platelets: 318 10*3/uL (ref 150–400)
RBC: 3.69 MIL/uL — ABNORMAL LOW (ref 4.22–5.81)
RDW: 13.1 % (ref 11.5–15.5)
WBC: 10.6 10*3/uL — ABNORMAL HIGH (ref 4.0–10.5)
nRBC: 0 % (ref 0.0–0.2)

## 2019-01-01 MED ORDER — SALINE SPRAY 0.65 % NA SOLN
1.0000 | NASAL | Status: DC | PRN
  Administered 2019-01-01: 12:00:00 1 via NASAL
  Filled 2019-01-01: qty 44

## 2019-01-01 NOTE — Progress Notes (Signed)
Timothy Fitzpatrick to be D/C'd  per MD order. Discussed with the patient and all questions fully answered.  VSS, Skin clean, dry and intact without evidence of skin break down, no evidence of skin tears noted.  IV catheter discontinued intact. Site without signs and symptoms of complications. Dressing and pressure applied.  An After Visit Summary was printed and given to the patient.   D/c education completed with patient/family including follow up instructions, medication list, d/c activities limitations if indicated, with other d/c instructions as indicated by MD - patient able to verbalize understanding, all questions fully answered.   Patient instructed to return to ED, call 911, or call MD for any changes in condition.   Patient to be escorted via [place], and D/C home via private auto.

## 2019-01-01 NOTE — Discharge Summary (Signed)
Physician Discharge Summary  Timothy Fitzpatrick WSF:[id number] DOB: [birth year] DOA: 70/10/2018  PCP: [name]., MD  Admit date: 12/29/2018 Discharge date: 01/01/2019  Admitted From: Home Disposition: Home  Recommendations for Outpatient Follow-up:  1. Follow-up with urology on Tuesday, 01/03/2019 as scheduled 2. Follow up with PCP in 1-2 weeks 3. Please obtain BMP/CBC in one week  Discharge Condition: Stable CODE STATUS: Full  Brief/Interim Summary: Timothy Austinis a 68 y.o.malewith medical history significant forbladder cancer, ulcerative pancolitis, and GERD whopresents to the ED for evaluation of 1 day decreased urinary output, abdominal bloating and discomfort, intermittent fevers, and decreased oral intake.Patient was recently found to have Enterobacter aerogenesUTI and started on a 7-day course of Bactrim by his urologist which he began on 12/26/2018. He was feeling well until the above symptoms began 1 day prior to admission. He states he has been taking Flomax with adequate urine output until the last 24 hours when he is just had dribbling. He has not had burning or pain with urination.  In the ED initial vitals showed BP 167/80, pulse 25, RR 20, temp 98.5 Fahrenheit, SPO2 96% on room air. Labs notable for WBC 19.2, hemoglobin 12.7, platelets 324,000, sodium 130, potassium 3.5, bicarb 21, BUN 24, creatinine 2.99, lipase 39, lactic acid 1.3. Urinalysis showed negative nitrites, trace leukocytes, 0-5 RBC/hpf, 0-5 WBC/hpf, rare bacteria on microscopy. Urine culture was obtained and pending. Blood cultures were also obtained and pending. SARS-CoV-2 test is negative. Foley catheter was placed with reported output of 1.5 [place] significant symptomatic improvement. CT abdomen/pelvis without contrast showed an asymmetrically enlarged appearance of the right kidney with increased right perinephric stranding, several nonobstructing calculi in the right renal collecting system, thickened bladder  wall decompressed by Foley catheter, cholelithiasis without evidence of acute cholecystitis, and mild prostatomegaly. Patient was given 500 cc normal saline x2 and the hospitalist service was consulted for further evaluation and management.  Patient admitted as above with urinary obstruction, relieved with Foley catheter.  Patient also had notable AKI which resolved with IV fluids and relief of obstruction. Follow-up with urology scheduled later this week for repeat Valstar dosing.  Patient will be discharged with Foley catheter in place, patient has had Foley catheter placed previously and understands how to manage in the outpatient setting.  Defer to urology at clinic for further evaluation and treatment and need for prolonged Foley catheter given treatment as above versus removal at their discretion.  Patient otherwise has reported UTI with cultures prior to admission positive for Enterobacter, treatment with antibiotics scheduled through tomorrow per previous documentation.  At this time patient otherwise stable and agreeable for discharge home with close follow-up as above.  Discharge Diagnoses:  Principal Problem:   Malignant neoplasm of lateral wall of urinary bladder (HCC) Active Problems:   AKI (acute kidney injury) ([place])   Ulcerative pancolitis ([place])   Acute urinary retention  Discharge Instructions    Call MD for:  temperature >100.4   Complete by: As directed    Diet - low sodium heart healthy   Complete by: As directed    Increase activity slowly   Complete by: As directed      Allergies as of 01/01/2019      Reactions   Penicillins    Did it involve swelling of the face/tongue/throat, SOB, or low BP? No Did it involve sudden or severe rash/hives, skin peeling, or any reaction on the inside of your mouth or nose? No Did you need to seek medical attention at a  hospital or doctor's office? No When did it last happen? If all above answers are "NO", may proceed with  cephalosporin use.      Medication List    TAKE these medications   saccharomyces boulardii 250 MG capsule Commonly known as: FLORASTOR Take 250 mg by mouth daily at 12 noon.   sulfamethoxazole-trimethoprim 800-160 MG tablet Commonly known as: BACTRIM DS Take 1 tablet by mouth 2 (two) times daily.   tamsulosin 0.4 MG Caps capsule Commonly known as: FLOMAX Take 0.4 mg by mouth daily at 12 noon.       Allergies  Allergen Reactions  . Penicillins     Did it involve swelling of the face/tongue/throat, SOB, or low BP? No Did it involve sudden or severe rash/hives, skin peeling, or any reaction on the inside of your mouth or nose? No Did you need to seek medical attention at a hospital or doctor's office? No When did it last happen? If all above answers are "NO", may proceed with cephalosporin use.   Procedures/Studies: Ct Abdomen Pelvis Wo Contrast  Result Date: 12/29/2018 CLINICAL DATA:  Generalized abdominal pain, currently receiving treatment for bladder cancer. Worsening nausea, vomiting and diarrhea. EXAM: CT ABDOMEN AND PELVIS WITHOUT CONTRAST TECHNIQUE: Multidetector CT imaging of the abdomen and pelvis was performed following the standard protocol without IV contrast. COMPARISON:  CT July 29, 2018 FINDINGS: Lower chest: Bandlike areas of scarring and/or atelectasis. Lung bases are otherwise clear. Normal heart size. No pericardial effusion. Hepatobiliary: No focal liver abnormality is seen. Stable appearance of the 1 cm gallstone near the gallbladder neck. No gallbladder wall thickening or biliary dilatation. Pancreas: Unremarkable. No pancreatic ductal dilatation or surrounding inflammatory changes. Spleen: Normal in size without focal abnormality. Adrenals/Urinary Tract: Normal adrenals. Asymmetrically enlarged appearance of the right kidney with increased right perinephric stranding. Several nonobstructing calculi are present in the right renal collecting system. No  ureteral calculi. The left kidney has a more normal appearance. Left ureter is unremarkable. The circumferentially thickened urinary bladder is largely decompressed over an inflated Foley catheter balloon with foci of gas within the lumen likely related to this instrumentation. Stomach/Bowel: Distal esophagus, stomach and duodenal sweep are unremarkable. No bowel wall thickening or dilatation. No evidence of obstruction. A normal appendix is visualized. Vascular/Lymphatic: Atherosclerotic plaque within the normal caliber aorta. Greater than expected number of upper mid mesenteric lymph nodes in a surrounding area of hazy mesenteric stranding compatible with a ?misty mesentery? similar to prior. No suspicious or enlarged lymph nodes in the included lymphatic chains. Reproductive: Mild prostatomegaly. Coarse eccentric calcification of the prostate. No concerning abnormalities of the prostate or seminal vesicles. Other: No abdominopelvic free fluid or free gas. No bowel containing hernias. Bilateral fat containing inguinal hernias. Musculoskeletal: No acute or significant osseous findings. Multilevel degenerative changes are present in the imaged portions of the spine. IMPRESSION: 1. Asymmetrically enlarged appearance of the right kidney with increased right perinephric stranding, concerning for pyelonephritis. Recommend correlation with urinalysis. 2. Several nonobstructing calculi in the right renal collecting system. 3. Circumferentially thickened bladder wall decompressed by Foley catheter. Thickening may be treatment related the exclusion of urinary tract infection is recommended. 4. Greater than expected number of upper mid mesenteric lymph nodes in a surrounding area of hazy mesenteric stranding compatible with mesenteritis, similar to prior. 5. Cholelithiasis without evidence of acute cholecystitis. 6. Mild prostatomegaly. 7. Aortic Atherosclerosis (ICD10-I70.0). Electronically Signed   By: Lovena Le M.D.    On: 12/29/2018 18:03    Subjective:  No acute issues or events overnight, denies chest pain, nausea, vomiting, diarrhea, constipation, shortness of breath, headache, fevers, chills.   Discharge Exam: Vitals:   12/31/18 1956 01/01/19 0403  BP: 112/64 121/72  Pulse: 79 73  Resp: 14 14  Temp: 98.9 F (37.2 C) 98.5 F (36.9 C)  SpO2: 94% 97%   Vitals:   12/31/18 1553 12/31/18 1956 01/01/19 0403 01/01/19 0604  BP: 117/63 112/64 121/72   Pulse: 80 79 73   Resp: 18 14 14    Temp: 98.8 F (37.1 C) 98.9 F (37.2 C) 98.5 F (36.9 C)   TempSrc: Oral Oral Oral   SpO2: 97% 94% 97%   Weight:    93 kg  Height:        General:  Pleasantly resting in bed, No acute distress. HEENT:  Normocephalic atraumatic.  Sclerae nonicteric, noninjected.  Extraocular movements intact bilaterally. Neck:  Without mass or deformity.  Trachea is midline. Lungs:  Clear to auscultate bilaterally without rhonchi, wheeze, or rales. Heart:  Regular rate and rhythm.  Without murmurs, rubs, or gallops. Abdomen:  Soft, nontender, nondistended.  Without guarding or rebound. Extremities: Without cyanosis, clubbing, edema, or obvious deformity. Vascular:  Dorsalis pedis and posterior tibial pulses palpable bilaterally. Skin:  Warm and dry, no erythema, no ulcerations. GU: Foley catheter intact, draining clear yellow urine  The results of significant diagnostics from this hospitalization (including imaging, microbiology, ancillary and laboratory) are listed below for reference.     Microbiology: Recent Results (from the past 240 hour(s))  SARS Coronavirus 2 [facility] order, Performed in [facility] hospital lab) Nasopharyngeal Nasopharyngeal Swab     Status: None   Collection Time: 12/29/18 12:20 PM   Specimen: Nasopharyngeal Swab  Result Value Ref Range Status   SARS Coronavirus 2 NEGATIVE NEGATIVE Final    Comment: (NOTE) If result is NEGATIVE SARS-CoV-2 target nucleic acids are NOT DETECTED. The SARS-CoV-2  RNA is generally detectable in upper and lower  respiratory specimens during the acute phase of infection. The lowest  concentration of SARS-CoV-2 viral copies this assay can detect is 250  copies / mL. A negative result does not preclude SARS-CoV-2 infection  and should not be used as the sole basis for treatment or other  patient management decisions.  A negative result may occur with  improper specimen collection / handling, submission of specimen other  than nasopharyngeal swab, presence of viral mutation(s) within the  areas targeted by this assay, and inadequate number of viral copies  (<250 copies / mL). A negative result must be combined with clinical  observations, patient history, and epidemiological information. If result is POSITIVE SARS-CoV-2 target nucleic acids are DETECTED. The SARS-CoV-2 RNA is generally detectable in upper and lower  respiratory specimens dur ing the acute phase of infection.  Positive  results are indicative of active infection with SARS-CoV-2.  Clinical  correlation with patient history and other diagnostic information is  necessary to determine patient infection status.  Positive results do  not rule out bacterial infection or co-infection with other viruses. If result is PRESUMPTIVE POSTIVE SARS-CoV-2 nucleic acids MAY BE PRESENT.   A presumptive positive result was obtained on the submitted specimen  and confirmed on repeat testing.  While 2019 novel coronavirus  (SARS-CoV-2) nucleic acids may be present in the submitted sample  additional confirmatory testing may be necessary for epidemiological  and / or clinical management purposes  to differentiate between  SARS-CoV-2 and other Sarbecovirus currently known to infect humans.  If  clinically indicated additional testing with an alternate test  methodology [phone]) is advised. The SARS-CoV-2 RNA is generally  detectable in upper and lower respiratory sp ecimens during the acute  phase of  infection. The expected result is Negative. Fact Sheet for Patients:  StrictlyIdeas.no Fact Sheet for Healthcare Providers: BankingDealers.co.za This test is not yet approved or cleared by the [place] FDA and has been authorized for detection and/or diagnosis of SARS-CoV-2 by FDA under an Emergency Use Authorization (EUA).  This EUA will remain in effect (meaning this test can be used) for the duration of the COVID-19 declaration under Section 564(b)(1) of the Act, 21 U.S.C. section 360bbb-3(b)(1), unless the authorization is terminated or revoked sooner. Performed at [facility], [place] [address] [place], [place] [address]   Urine culture     Status: None   Collection Time: 12/29/18 12:37 PM   Specimen: Urine, Random  Result Value Ref Range Status   Specimen Description URINE, RANDOM  Final   Special Requests NONE  Final   Culture   Final    NO GROWTH Performed at [facility], [place] [address] [place], [place] [address]    Report Status 12/30/2018 FINAL  Final  Blood culture (routine x 2)     Status: None (Preliminary result)   Collection Time: 12/29/18 12:46 PM   Specimen: BLOOD  Result Value Ref Range Status   Specimen Description BLOOD RIGHT ANTECUBITAL  Final   Special Requests   Final    BOTTLES DRAWN AEROBIC AND ANAEROBIC Blood Culture results may not be optimal due to an excessive volume of blood received in culture bottles   Culture   Final    NO GROWTH 3 DAYS Performed at [facility], [place] [address], [place], [place] [address]    Report Status PENDING  Incomplete  Blood culture (routine x 2)     Status: None (Preliminary result)   Collection Time: 12/29/18 12:53 PM   Specimen: BLOOD RIGHT HAND  Result Value Ref Range Status   Specimen Description BLOOD RIGHT HAND  Final   Special Requests   Final    BOTTLES DRAWN AEROBIC AND ANAEROBIC Blood Culture adequate volume   Culture   Final    NO  GROWTH 3 DAYS Performed at [facility], [place] [address] [place], [place] [address]    Report Status PENDING  Incomplete     Labs: BNP (last 3 results) No results for input(s): BNP in the last 8760 hours. Basic Metabolic Panel: Recent Labs  Lab 12/29/18 1053 12/30/18 0506 12/31/18 0229 01/01/19 0328  NA 130* 139 137 138  K 3.5 3.9 3.7 4.0  CL 93* 103 100 102  CO2 21* 26 24 24   GLUCOSE 134* 104* 89 90  BUN 24* 15 12 11   CREATININE 2.99* 1.21 0.93 0.93  CALCIUM 8.5* 8.3* 8.4* 8.6*   Liver Function Tests: Recent Labs  Lab 12/29/18 1053  AST 24  ALT 34  ALKPHOS 62  BILITOT 0.7  PROT 7.3  ALBUMIN 3.1*   Recent Labs  Lab 12/29/18 1053  LIPASE 39   No results for input(s): AMMONIA in the last 168 hours. CBC: Recent Labs  Lab 12/29/18 1053 12/30/18 0506 12/31/18 0229 01/01/19 0328  WBC 19.2* 12.4* 13.6* 10.6*  HGB 12.7* 11.0* 10.3* 11.3*  HCT 37.3* 32.2* 30.5* 34.2*  MCV 89.7 91.2 92.1 92.7  PLT 324 290 320 318   Cardiac Enzymes: No results for input(s): CKTOTAL, CKMB,  CKMBINDEX, TROPONINI in the last 168 hours. BNP: Invalid input(s): POCBNP CBG: No results for input(s): GLUCAP in the last 168 hours. D-Dimer No results for input(s): DDIMER in the last 72 hours. Hgb A1c No results for input(s): HGBA1C in the last 72 hours. Lipid Profile No results for input(s): CHOL, HDL, LDLCALC, TRIG, CHOLHDL, LDLDIRECT in the last 72 hours. Thyroid function studies No results for input(s): TSH, T4TOTAL, T3FREE, THYROIDAB in the last 72 hours.  Invalid input(s): FREET3 Anemia work up No results for input(s): VITAMINB12, FOLATE, FERRITIN, TIBC, IRON, RETICCTPCT in the last 72 hours. Urinalysis    Component Value Date/Time   COLORURINE STRAW (A) 12/29/2018 1237   APPEARANCEUR CLEAR 12/29/2018 1237   LABSPEC 1.005 12/29/2018 1237   PHURINE 5.0 12/29/2018 1237   GLUCOSEU NEGATIVE 12/29/2018 1237   HGBUR MODERATE (A) 12/29/2018 1237   BILIRUBINUR NEGATIVE  12/29/2018 1237   KETONESUR NEGATIVE 12/29/2018 1237   PROTEINUR NEGATIVE 12/29/2018 1237   NITRITE NEGATIVE 12/29/2018 1237   LEUKOCYTESUR TRACE (A) 12/29/2018 1237   Sepsis Labs Invalid input(s): PROCALCITONIN,  WBC,  LACTICIDVEN Microbiology Recent Results (from the past 240 hour(s))  SARS Coronavirus 2 [facility] order, Performed in [facility] hospital lab) Nasopharyngeal Nasopharyngeal Swab     Status: None   Collection Time: 12/29/18 12:20 PM   Specimen: Nasopharyngeal Swab  Result Value Ref Range Status   SARS Coronavirus 2 NEGATIVE NEGATIVE Final    Comment: (NOTE) If result is NEGATIVE SARS-CoV-2 target nucleic acids are NOT DETECTED. The SARS-CoV-2 RNA is generally detectable in upper and lower  respiratory specimens during the acute phase of infection. The lowest  concentration of SARS-CoV-2 viral copies this assay can detect is 250  copies / mL. A negative result does not preclude SARS-CoV-2 infection  and should not be used as the sole basis for treatment or other  patient management decisions.  A negative result may occur with  improper specimen collection / handling, submission of specimen other  than nasopharyngeal swab, presence of viral mutation(s) within the  areas targeted by this assay, and inadequate number of viral copies  (<250 copies / mL). A negative result must be combined with clinical  observations, patient history, and epidemiological information. If result is POSITIVE SARS-CoV-2 target nucleic acids are DETECTED. The SARS-CoV-2 RNA is generally detectable in upper and lower  respiratory specimens dur ing the acute phase of infection.  Positive  results are indicative of active infection with SARS-CoV-2.  Clinical  correlation with patient history and other diagnostic information is  necessary to determine patient infection status.  Positive results do  not rule out bacterial infection or co-infection with other viruses. If result is PRESUMPTIVE  POSTIVE SARS-CoV-2 nucleic acids MAY BE PRESENT.   A presumptive positive result was obtained on the submitted specimen  and confirmed on repeat testing.  While 2019 novel coronavirus  (SARS-CoV-2) nucleic acids may be present in the submitted sample  additional confirmatory testing may be necessary for epidemiological  and / or clinical management purposes  to differentiate between  SARS-CoV-2 and other Sarbecovirus currently known to infect humans.  If clinically indicated additional testing with an alternate test  methodology [phone]) is advised. The SARS-CoV-2 RNA is generally  detectable in upper and lower respiratory sp ecimens during the acute  phase of infection. The expected result is Negative. Fact Sheet for Patients:  StrictlyIdeas.no Fact Sheet for Healthcare Providers: BankingDealers.co.za This test is not yet approved or cleared by the [place] FDA and has  been authorized for detection and/or diagnosis of SARS-CoV-2 by FDA under an Emergency Use Authorization (EUA).  This EUA will remain in effect (meaning this test can be used) for the duration of the COVID-19 declaration under Section 564(b)(1) of the Act, 21 U.S.C. section 360bbb-3(b)(1), unless the authorization is terminated or revoked sooner. Performed at [facility], [place] [address], [place], [place] [address]   Urine culture     Status: None   Collection Time: 12/29/18 12:37 PM   Specimen: Urine, Random  Result Value Ref Range Status   Specimen Description URINE, RANDOM  Final   Special Requests NONE  Final   Culture   Final    NO GROWTH Performed at [facility], [place] [address], [place], [place] [address]    Report Status 12/30/2018 FINAL  Final  Blood culture (routine x 2)     Status: None (Preliminary result)   Collection Time: 12/29/18 12:46 PM   Specimen: BLOOD  Result Value Ref Range Status   Specimen Description BLOOD RIGHT  ANTECUBITAL  Final   Special Requests   Final    BOTTLES DRAWN AEROBIC AND ANAEROBIC Blood Culture results may not be optimal due to an excessive volume of blood received in culture bottles   Culture   Final    NO GROWTH 3 DAYS Performed at [facility], [place] [address] [place], [place] [address]    Report Status PENDING  Incomplete  Blood culture (routine x 2)     Status: None (Preliminary result)   Collection Time: 12/29/18 12:53 PM   Specimen: BLOOD RIGHT HAND  Result Value Ref Range Status   Specimen Description BLOOD RIGHT HAND  Final   Special Requests   Final    BOTTLES DRAWN AEROBIC AND ANAEROBIC Blood Culture adequate volume   Culture   Final    NO GROWTH 3 DAYS Performed at [facility], [place] [address], [place], [place] [address]    Report Status PENDING  Incomplete   Time coordinating discharge: Over 30 minutes  SIGNED:  Little Ishikawa, DO [facility] 01/01/2019, 11:23 AM Pager   If 7PM-7AM, please contact night-coverage [email or web address] Password [email or web address]

## 2019-01-03 LAB — CULTURE, BLOOD (ROUTINE X 2)
Culture: NO GROWTH
Culture: NO GROWTH
Special Requests: ADEQUATE

## 2019-02-21 ENCOUNTER — Other Ambulatory Visit: Payer: Self-pay

## 2019-02-21 ENCOUNTER — Encounter (HOSPITAL_COMMUNITY): Payer: Self-pay | Admitting: Emergency Medicine

## 2019-02-21 ENCOUNTER — Emergency Department (HOSPITAL_COMMUNITY)
Admission: EM | Admit: 2019-02-21 | Discharge: 2019-02-22 | Disposition: A | Discharge status: Home / Self Care | Payer: Medicare Other | Attending: Emergency Medicine | Admitting: Emergency Medicine

## 2019-02-21 DIAGNOSIS — C679 Malignant neoplasm of bladder, unspecified: Secondary | ICD-10-CM | POA: Insufficient documentation

## 2019-02-21 DIAGNOSIS — Z960 Presence of urogenital implants: Secondary | ICD-10-CM | POA: Diagnosis not present

## 2019-02-21 DIAGNOSIS — H9202 Otalgia, left ear: Secondary | ICD-10-CM | POA: Diagnosis present

## 2019-02-21 DIAGNOSIS — Z20828 Contact with and (suspected) exposure to other viral communicable diseases: Secondary | ICD-10-CM | POA: Diagnosis not present

## 2019-02-21 DIAGNOSIS — H6690 Otitis media, unspecified, unspecified ear: Secondary | ICD-10-CM

## 2019-02-21 DIAGNOSIS — N3000 Acute cystitis without hematuria: Secondary | ICD-10-CM | POA: Diagnosis not present

## 2019-02-21 LAB — CBC WITH DIFFERENTIAL/PLATELET
Abs Immature Granulocytes: 0.01 10*3/uL (ref 0.00–0.07)
Basophils Absolute: 0.1 10*3/uL (ref 0.0–0.1)
Basophils Relative: 1 %
Eosinophils Absolute: 0 10*3/uL (ref 0.0–0.5)
Eosinophils Relative: 0 %
HCT: 38.9 % — ABNORMAL LOW (ref 39.0–52.0)
Hemoglobin: 12.4 g/dL — ABNORMAL LOW (ref 13.0–17.0)
Immature Granulocytes: 0 %
Lymphocytes Relative: 36 %
Lymphs Abs: 2.7 10*3/uL (ref 0.7–4.0)
MCH: 29.7 pg (ref 26.0–34.0)
MCHC: 31.9 g/dL (ref 30.0–36.0)
MCV: 93.3 fL (ref 80.0–100.0)
Monocytes Absolute: 0.8 10*3/uL (ref 0.1–1.0)
Monocytes Relative: 11 %
Neutro Abs: 3.9 10*3/uL (ref 1.7–7.7)
Neutrophils Relative %: 52 %
Platelets: 186 10*3/uL (ref 150–400)
RBC: 4.17 MIL/uL — ABNORMAL LOW (ref 4.22–5.81)
RDW: 13.2 % (ref 11.5–15.5)
WBC: 7.5 10*3/uL (ref 4.0–10.5)
nRBC: 0 % (ref 0.0–0.2)

## 2019-02-21 LAB — COMPREHENSIVE METABOLIC PANEL
ALT: 21 U/L (ref 0–44)
AST: 20 U/L (ref 15–41)
Albumin: 3.9 g/dL (ref 3.5–5.0)
Alkaline Phosphatase: 56 U/L (ref 38–126)
Anion gap: 9 (ref 5–15)
BUN: 11 mg/dL (ref 8–23)
CO2: 25 mmol/L (ref 22–32)
Calcium: 9.3 mg/dL (ref 8.9–10.3)
Chloride: 103 mmol/L (ref 98–111)
Creatinine, Ser: 0.84 mg/dL (ref 0.61–1.24)
GFR calc Af Amer: >60 mL/min (ref >60)
GFR calc non Af Amer: >60 mL/min (ref >60)
Glucose, Bld: 118 mg/dL — ABNORMAL HIGH (ref 70–99)
Potassium: 4.3 mmol/L (ref 3.5–5.1)
Sodium: 137 mmol/L (ref 135–145)
Total Bilirubin: 0.5 mg/dL (ref 0.3–1.2)
Total Protein: 7.5 g/dL (ref 6.5–8.1)

## 2019-02-21 LAB — URINALYSIS, ROUTINE W REFLEX MICROSCOPIC
Bilirubin Urine: NEGATIVE
Glucose, UA: NEGATIVE mg/dL
Hgb urine dipstick: NEGATIVE
Ketones, ur: NEGATIVE mg/dL
Nitrite: NEGATIVE
Protein, ur: 100 mg/dL — AB
Specific Gravity, Urine: 1.021 (ref 1.005–1.030)
WBC, UA: >50 WBC/hpf — ABNORMAL HIGH (ref 0–5)
pH: 5 (ref 5.0–8.0)

## 2019-02-21 NOTE — ED Triage Notes (Addendum)
Patient requesting UTI screening reports fever with chills today and right lower back pain last week , he has an indwelling urinary catheter connected to a leg bag .

## 2019-02-22 ENCOUNTER — Emergency Department (HOSPITAL_COMMUNITY): Payer: Medicare Other

## 2019-02-22 DIAGNOSIS — N3000 Acute cystitis without hematuria: Secondary | ICD-10-CM | POA: Diagnosis not present

## 2019-02-22 LAB — SARS CORONAVIRUS 2 (TAT 6-24 HRS): SARS Coronavirus 2: NEGATIVE

## 2019-02-22 MED ORDER — NITROFURANTOIN MONOHYD MACRO 100 MG PO CAPS: 100.0000 mg | ORAL_CAPSULE | Freq: Two times a day (BID) | ORAL | 0 refills | Status: AC

## 2019-02-22 MED ORDER — CEFDINIR 300 MG PO CAPS
300.0000 mg | ORAL_CAPSULE | Freq: Once | ORAL | Status: AC
  Administered 2019-02-22: 300 mg via ORAL
  Filled 2019-02-22: qty 1

## 2019-02-22 MED ORDER — CEPHALEXIN 250 MG PO CAPS
500.0000 mg | ORAL_CAPSULE | Freq: Once | ORAL | Status: DC
  Filled 2019-02-22: qty 2

## 2019-02-22 MED ORDER — NITROFURANTOIN MONOHYD MACRO 100 MG PO CAPS
100.0000 mg | ORAL_CAPSULE | Freq: Once | ORAL | Status: AC
  Administered 2019-02-22: 06:00:00 100 mg via ORAL
  Filled 2019-02-22: qty 1

## 2019-02-22 MED ORDER — ACETAMINOPHEN 500 MG PO TABS
1000.0000 mg | ORAL_TABLET | Freq: Once | ORAL | Status: AC
  Administered 2019-02-22: 05:00:00 1000 mg via ORAL
  Filled 2019-02-22: qty 2

## 2019-02-22 MED ORDER — CEFDINIR 300 MG PO CAPS: 300.0000 mg | ORAL_CAPSULE | Freq: Two times a day (BID) | ORAL | 0 refills | Status: AC

## 2019-02-22 NOTE — ED Provider Notes (Signed)
Medical screening examination/treatment/procedure(s) were conducted as a shared visit with non-physician practitioner(s) and myself.  I personally evaluated the patient during the encounter.      Patient is a 68 year old male with history of bladder cancer currently receiving chemotherapy with last dose on 02/07/19 who presents to the emergency department with complaints of fever that has been intermittent since indwelling Foley catheter was exchanged on 02/14/2019.  Fever was 101 yesterday.  Has had some left ear pain, dry cough in the morning with congestion.  No headache, neck pain or neck stiffness, abdominal pain, chest pain or shortness of breath, vomiting or diarrhea, hematuria, rash, tick bites.  No recent travel or sick contacts.  Labs here are reassuring.  Urine shows leukocyte esterase and white blood cells with few bacteria.  Did discuss with patient that this could just be secondary to having a Foley catheter in place.  He has previously grown Enterobacter and enterococcus per [facility] records.  States his doctor would like a COVID test done today which I feel is very reasonable.  Will also obtain chest x-ray and blood cultures.  He appears to have the beginnings of a left otitis media on exam.  Will start patient on cefdinir to cover his otitis media and UTI as well as Macrobid.  Urine culture pending.  Plan is for discharge home.  He has close outpatient follow-up.  Patient wife comfortable with plan.  He is nontoxic-appearing today.   Angila Wombles, Delice Bison, DO 02/22/19 [phone]

## 2019-02-22 NOTE — Discharge Instructions (Addendum)
Your chest x-ray is negative for infection. You are being treated with antibiotics for an infection in your urine and your ear. Take the medications as prescribed. Follow up with your doctor for recheck in one week.   You received a test for COVID-19 tonight that will result in 6-24 hours. The results can be obtained through [place] and discussed with your doctor.

## 2019-02-22 NOTE — ED Provider Notes (Signed)
[facility] [facility] EMERGENCY DEPARTMENT Provider Note   CSN: [id number] Arrival date & time: 02/21/19  1909     History   Chief Complaint Chief Complaint  Patient presents with  . Urinary Tract Infection    HPI Timothy Fitzpatrick is a 68 y.o. male.     Patient with a history of bladder cancer s/p tumor resection on chemo, urinary retention with indwelling foley catheter since August 2020, presents with "bladder" discomfort and fever (Tmax 101) and chills since yesterday. He reports the catheter is draining well. It was last exchanged on 02/14/19. No nausea, vomiting, hematuria, testicular pain.   The history is provided by the patient and the spouse. No language interpreter was used.  Urinary Tract Infection Associated symptoms: fever   Associated symptoms: no abdominal pain, no flank pain and no hematuria     Past Medical History:  Diagnosis Date  . Bladder cancer [facility])     Patient Active Problem List   Diagnosis Date Noted  . AKI (acute kidney injury) ([place]) 12/29/2018  . Acute urinary retention   . 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 TRANSURETHRAL RESECTION OF BLADDER TUMOR  11/29/2018        Home Medications    Prior to Admission medications   Medication Sig Start Date End Date Taking? Authorizing Provider  saccharomyces boulardii (FLORASTOR) 250 MG capsule Take 250 mg by mouth daily at 12 noon.    [provider]  sulfamethoxazole-trimethoprim (BACTRIM DS) 800-160 MG tablet Take 1 tablet by mouth 2 (two) times daily. 12/26/18   [provider]  tamsulosin (FLOMAX) 0.4 MG CAPS capsule Take 0.4 mg by mouth daily at 12 noon.  11/16/18   [provider]    Family History Family History  Problem Relation Age of Onset  . Colon polyps Mother     Social History Social History   Tobacco Use  .  Smoking status: Former Research scientist (life sciences)  . Smokeless tobacco: Never Used  Substance Use Topics  . Alcohol use: Never    Frequency: Never  . Drug use: Never     Allergies   Penicillins   Review of Systems Review of Systems  Constitutional: Positive for chills and fever.  Respiratory: Negative for cough and shortness of breath.   Cardiovascular: Negative for chest pain.  Gastrointestinal: Negative for abdominal pain.  Genitourinary: Negative for flank pain, hematuria and testicular pain.       See HPI.  Musculoskeletal: Negative for myalgias.  Neurological: Negative for weakness.     Physical Exam Updated Vital Signs BP 134/79 (BP Location: Left Arm)   Pulse 91   Temp 98.5 F (36.9 C) (Oral)   Resp 18   SpO2 98%   Physical Exam Vitals signs and nursing note reviewed.  Constitutional:      General: He is not in acute distress.    Appearance: Normal appearance. He is not toxic-appearing.  Pulmonary:     Effort: Pulmonary effort is normal.  Abdominal:     General: There is no distension.     Tenderness: There is no abdominal tenderness. There is no guarding.  Musculoskeletal: Normal range of motion.  Skin:    General: Skin is warm and dry.  Neurological:     General: No focal deficit present.     Mental Status: He is alert and oriented to person, place, and time.  ED Treatments / Results  Labs (all labs ordered are listed, but only abnormal results are displayed) Labs Reviewed  URINALYSIS, ROUTINE W REFLEX MICROSCOPIC - Abnormal; Notable for the following components:      Result Value   APPearance CLOUDY (*)    Protein, ur 100 (*)    Leukocytes,Ua LARGE (*)    WBC, UA >50 (*)    Bacteria, UA FEW (*)    All other components within normal limits  CBC WITH DIFFERENTIAL/PLATELET - Abnormal; Notable for the following components:   RBC 4.17 (*)    Hemoglobin 12.4 (*)    HCT 38.9 (*)    All other components within normal limits  COMPREHENSIVE METABOLIC PANEL -  Abnormal; Notable for the following components:   Glucose, Bld 118 (*)    All other components within normal limits  URINE CULTURE    EKG None  Radiology No results found.  Procedures Procedures (including critical care time)  Medications Ordered in ED Medications - No data to display   Initial Impression / Assessment and Plan / ED Course  I have reviewed the triage vital signs and the nursing notes.  Pertinent labs & imaging results that were available during my care of the patient were reviewed by me and considered in my medical decision making (see chart for details).        Patient to ED with symptoms of "bladder" pain and fever with indwelling foley catheter. No vomiting.   He is under treatment for bladder cancer with complication of neurogenic bladder requiring foley since August.   Further information given to attending [facility] that followed initial HPI: patient has had fevers since catheter was inserted initially. The patient also reports left ear pain, infrequent cough. Patient and wife report his physician was concerned regarding other sources of infection, COVID. Additional tests have been ordered.   Review of previous urine cultures at [facility] show growth predominantly of enterococcus and enterobacter. His ear appears erythematous suggesting possible otitis. Will add blood cultures, CXR, COVID (send out) testing.   Discussed antibiotic coverage with pharmacy with goal of one agent for both UTI and otitis, however, recommendation is for cefdinir and macrobid. The patient is started on these medications in the ED prior to discharge. CXR negative.   The patient is felt appropriate for discharge home with urology and PCP follow up.     Final Clinical Impressions(s) / ED Diagnoses   Final diagnoses:  None   1. UTI 2. Indwelling foley catheter 3. Otitis media  ED Discharge Orders    None       Charlann Lange, PA-C 02/24/19 [place], Delice Bison, DO 02/24/19 [phone]

## 2019-02-24 LAB — URINE CULTURE: Culture: 60000 — AB

## 2019-02-25 ENCOUNTER — Telehealth: Payer: Self-pay | Admitting: Emergency Medicine

## 2019-02-25 ENCOUNTER — Telehealth (HOSPITAL_COMMUNITY): Payer: Self-pay | Admitting: Pharmacist

## 2019-02-25 NOTE — Telephone Encounter (Signed)
Post ED Visit - Positive Culture Follow-up: Successful Patient Follow-Up  Culture assessed and recommendations reviewed by:  []  Elenor Quinones, Pharm.D. []  Heide Guile, Pharm.D., BCPS AQ-ID []  Parks Neptune, Pharm.D., BCPS []  Alycia Rossetti, Pharm.D., BCPS []  [place], Pharm.D., BCPS, AAHIVP []  Legrand Como, Pharm.D., BCPS, AAHIVP []  Salome Arnt, PharmD, BCPS []  Johnnette Gourd, PharmD, BCPS [x]  Hughes Better, PharmD, BCPS []  Leeroy Cha, PharmD  Positive urine culture  []  Patient discharged without antimicrobial prescription and treatment is now indicated [x]  Organism is resistant to prescribed ED discharge antimicrobial []  Patient with positive blood cultures  Changes discussed with ED provider: Martinique Robinson, PA New antibiotic prescription:  Bactrim DS, one tab PO BID x three days. Called to [place] [phone].  Contacted patient, date 02/25/2019  time [place] 02/25/2019, 5:19 PM

## 2019-02-25 NOTE — Progress Notes (Signed)
ED Antimicrobial Stewardship Positive Culture Follow Up   Timothy Fitzpatrick is an 68 y.o. male who presented to [facility] on (Not on file) with a chief complaint of No chief complaint on file.   Recent Results (from the past 720 hour(s))  Urine culture     Status: Abnormal   Collection Time: 02/21/19  8:25 PM   Specimen: Urine, Random  Result Value Ref Range Status   Specimen Description URINE, RANDOM  Final   Special Requests   Final    ADDED [id number] 02/22/2019 Performed at [facility], [place] [address] [place], [place] [address]    Culture 60,000 COLONIES/mL ENTEROBACTER AEROGENES (A)  Final   Report Status 02/24/2019 FINAL  Final   Organism ID, Bacteria ENTEROBACTER AEROGENES (A)  Final      Susceptibility   Enterobacter aerogenes - MIC*    CEFAZOLIN RESISTANT Resistant     CEFTRIAXONE <=1 SENSITIVE Sensitive     CIPROFLOXACIN <=0.25 SENSITIVE Sensitive     GENTAMICIN <=1 SENSITIVE Sensitive     IMIPENEM 2 SENSITIVE Sensitive     NITROFURANTOIN 128 RESISTANT Resistant     TRIMETH/SULFA <=20 SENSITIVE Sensitive     PIP/TAZO 8 SENSITIVE Sensitive     * 60,000 COLONIES/mL ENTEROBACTER AEROGENES  Culture, blood (routine x 2)     Status: None (Preliminary result)   Collection Time: 02/22/19  5:43 AM   Specimen: BLOOD RIGHT FOREARM  Result Value Ref Range Status   Specimen Description BLOOD RIGHT FOREARM  Final   Special Requests   Final    BOTTLES DRAWN AEROBIC AND ANAEROBIC Blood Culture adequate volume   Culture   Final    NO GROWTH 2 DAYS Performed at [facility], [address], [place], [place] [address]    Report Status PENDING  Incomplete  Culture, blood (routine x 2)     Status: None (Preliminary result)   Collection Time: 02/22/19  5:47 AM   Specimen: BLOOD LEFT FOREARM  Result Value Ref Range Status   Specimen Description BLOOD LEFT FOREARM  Final   Special Requests   Final    BOTTLES DRAWN AEROBIC AND ANAEROBIC Blood Culture adequate volume   Culture    Final    NO GROWTH 2 DAYS Performed at [facility], [place] [address], [place], [place] [address]    Report Status PENDING  Incomplete  SARS CORONAVIRUS 2 (TAT 6-24 HRS) Nasopharyngeal Nasopharyngeal Swab     Status: None   Collection Time: 02/22/19  5:48 AM   Specimen: Nasopharyngeal Swab  Result Value Ref Range Status   SARS Coronavirus 2 NEGATIVE NEGATIVE Final    Comment: (NOTE) SARS-CoV-2 target nucleic acids are NOT DETECTED. The SARS-CoV-2 RNA is generally detectable in upper and lower respiratory specimens during the acute phase of infection. Negative results do not preclude SARS-CoV-2 infection, do not rule out co-infections with other pathogens, and should not be used as the sole basis for treatment or other patient management decisions. Negative results must be combined with clinical observations, patient history, and epidemiological information. The expected result is Negative. Fact Sheet for Patients: SugarRoll.be Fact Sheet for Healthcare Providers: https://www.woods-mathews.com/ This test is not yet approved or cleared by the [place] FDA and  has been authorized for detection and/or diagnosis of SARS-CoV-2 by FDA under an Emergency Use Authorization (EUA). This EUA will remain  in effect (meaning this test can be used) for the duration of the COVID-19 declaration under Section 56 4(b)(1)  of the Act, 21 U.S.C. section 360bbb-3(b)(1), unless the authorization is terminated or revoked sooner. Performed at [facility], [place] [address] [place], [place] [address]     **Please do symptom check. If symptomatic can have the patient stop taking nitrofurantoin and can begin taking:  New antibiotic prescription: Bactrim DS 1 tab po BID x 3 days  ED Provider: Donnelly Angelica, PA-C   MastersJake Church 02/25/2019, 11:22 AM Clinical Pharmacist Monday - Friday phone -  [phone] Saturday - Sunday phone -  [phone]

## 2019-02-27 LAB — CULTURE, BLOOD (ROUTINE X 2)
Culture: NO GROWTH
Culture: NO GROWTH
Special Requests: ADEQUATE
Special Requests: ADEQUATE

## 2019-04-14 ENCOUNTER — Encounter (HOSPITAL_COMMUNITY): Payer: Self-pay | Admitting: Emergency Medicine

## 2019-04-14 ENCOUNTER — Other Ambulatory Visit: Payer: Self-pay

## 2019-04-14 ENCOUNTER — Emergency Department (HOSPITAL_COMMUNITY)
Admission: EM | Admit: 2019-04-14 | Discharge: 2019-04-14 | Disposition: A | Discharge status: Home / Self Care | Payer: Medicare Other | Attending: Emergency Medicine | Admitting: Emergency Medicine

## 2019-04-14 DIAGNOSIS — Y732 Prosthetic and other implants, materials and accessory gastroenterology and urology devices associated with adverse incidents: Secondary | ICD-10-CM | POA: Insufficient documentation

## 2019-04-14 DIAGNOSIS — Z87891 Personal history of nicotine dependence: Secondary | ICD-10-CM | POA: Diagnosis not present

## 2019-04-14 DIAGNOSIS — R339 Retention of urine, unspecified: Secondary | ICD-10-CM | POA: Diagnosis present

## 2019-04-14 DIAGNOSIS — Z79899 Other long term (current) drug therapy: Secondary | ICD-10-CM | POA: Diagnosis not present

## 2019-04-14 DIAGNOSIS — D090 Carcinoma in situ of bladder: Secondary | ICD-10-CM | POA: Insufficient documentation

## 2019-04-14 DIAGNOSIS — T839XXA Unspecified complication of genitourinary prosthetic device, implant and graft, initial encounter: Secondary | ICD-10-CM | POA: Diagnosis not present

## 2019-04-14 LAB — URINALYSIS, ROUTINE W REFLEX MICROSCOPIC
Bilirubin Urine: NEGATIVE
Glucose, UA: NEGATIVE mg/dL
Hgb urine dipstick: NEGATIVE
Ketones, ur: NEGATIVE mg/dL
Leukocytes,Ua: NEGATIVE
Nitrite: NEGATIVE
Protein, ur: NEGATIVE mg/dL
Specific Gravity, Urine: 1.016 (ref 1.005–1.030)
pH: 5 (ref 5.0–8.0)

## 2019-04-14 NOTE — ED Triage Notes (Signed)
Pt from home c/o urinary retention.  Bladder surgery in June 2020 d/t bladder cancer. Pt with recent UTI since. Currently taking bacterium and Macrobid.   Per pt. Foley Cath "came out" yesterday afternoon around 9 pm. Pt denies any blood/trauma.

## 2019-04-14 NOTE — ED Notes (Signed)
Bladder scan showed 452 ml. Pt stated that he hasn't able to urinate in 3 months.

## 2019-04-14 NOTE — ED Notes (Signed)
Discharge instructions discussed with pt. Pt verbalized understanding with no questions at this time.  

## 2019-04-14 NOTE — ED Provider Notes (Signed)
Emergency Department Provider Note   I have reviewed the triage vital signs and the nursing notes.   HISTORY  Chief Complaint Urinary Retention   HPI Timothy Fitzpatrick is a 68 y.o. male who has a history of prostate issues actually pending TURP in a couple days who had an issue with his Foley catheter and ultimately the balloon deflated and it fell out and he has had urinary retention for the last 2 hours since then.  No other changes.  No back pain.  No difficulty walking.   No other associated or modifying symptoms.    Past Medical History:  Diagnosis Date  . Bladder cancer [facility])     Patient Active Problem List   Diagnosis Date Noted  . AKI (acute kidney injury) ([place]) 12/29/2018  . Acute urinary retention   . 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 TRANSURETHRAL RESECTION OF BLADDER TUMOR  11/29/2018    Current Outpatient Rx  . Order #: [id number] Class: Normal  . Order #: [id number] Class: Historical Med  . Order #: [id number] Class: Historical Med    Allergies Penicillins  Family History  Problem Relation Age of Onset  . Colon polyps Mother     Social History Social History   Tobacco Use  . Smoking status: Former Research scientist (life sciences)  . Smokeless tobacco: Never Used  Substance Use Topics  . Alcohol use: Never    Frequency: Never  . Drug use: Never    Review of Systems  All other systems negative except as documented in the HPI. All pertinent positives and negatives as reviewed in the HPI. ____________________________________________   PHYSICAL EXAM:  VITAL SIGNS: ED Triage Vitals  Enc Vitals Group     BP 04/14/19 0329 (!) 152/76     Pulse Rate 04/14/19 0329 90     Resp 04/14/19 0329 16     Temp 04/14/19 0329 98.9 F (37.2 C)     Temp Source 04/14/19 0329 Oral     SpO2 04/14/19 0329 95 %     Weight 04/14/19 0356 200 lb (90.7  kg)     Height 04/14/19 0356 5\' 10"  (1.778 m)     Head Circumference --      Peak Flow --      Pain Score 04/14/19 0355 0     Pain Loc --      Pain Edu? --      Excl. in [place]? --     Constitutional: Alert and oriented. Well appearing and in no acute distress. Eyes: Conjunctivae are normal. PERRL. EOMI. Head: Atraumatic. Nose: No congestion/rhinnorhea. Mouth/Throat: Mucous membranes are moist.  Oropharynx non-erythematous. Neck: No stridor.  No meningeal signs.   Cardiovascular: Normal rate, regular rhythm. Good peripheral circulation. Grossly normal heart sounds.   Respiratory: Normal respiratory effort.  No retractions. Lungs CTAB. Gastrointestinal: Soft and ttp suprapubically. No distention.  Musculoskeletal: No lower extremity tenderness nor edema. No gross deformities of extremities. Neurologic:  Normal speech and language. No gross focal neurologic deficits are appreciated.  Skin:  Skin is warm, dry and intact. No rash noted.   ____________________________________________   LABS (all labs ordered are listed, but only abnormal results are displayed)  Labs Reviewed  URINALYSIS, ROUTINE W REFLEX MICROSCOPIC   ____________________________________________  INITIAL IMPRESSION / ASSESSMENT AND PLAN / ED COURSE  Catheter replaced, resolution of symptoms. No infection. Dc for urology follow up.  Pertinent labs & imaging results that were available during my care of the patient were reviewed by me and considered in my medical decision making (see chart for details).  A medical screening exam was performed and I feel the patient has had an appropriate workup for their chief complaint at this time and likelihood of emergent condition existing is low. They have been counseled on decision, discharge, follow up and which symptoms necessitate immediate return to the emergency department. They or their family verbally stated understanding and agreement with plan and discharged in  stable condition.   ____________________________________________  FINAL CLINICAL IMPRESSION(S) / ED DIAGNOSES  Final diagnoses:  Urinary retention  Complication of Foley catheter, initial encounter ([place])     MEDICATIONS GIVEN DURING THIS VISIT:  Medications - No data to display   NEW OUTPATIENT MEDICATIONS STARTED DURING THIS VISIT:  New Prescriptions   No medications on file    Note:  This note was prepared with assistance of Dragon voice recognition software. Occasional wrong-word or sound-a-like substitutions may have occurred due to the inherent limitations of voice recognition software.   Asiel Chrostowski, Corene Cornea, MD 04/14/19 [phone]

## 2020-11-11 DIAGNOSIS — I517 Cardiomegaly: Secondary | ICD-10-CM | POA: Diagnosis not present

## 2021-06-26 IMAGING — CT CT ABDOMEN AND PELVIS WITHOUT CONTRAST
2 of 4 series · 15 of 46 positions shown, 17 images · non-contrast
Comparison: CT July 29, 2018

CLINICAL DATA: Generalized abdominal pain, currently receiving
treatment for bladder cancer. Worsening nausea, vomiting and
diarrhea.

EXAM:
CT ABDOMEN AND PELVIS WITHOUT CONTRAST
TECHNIQUE: Multidetector CT imaging of the abdomen and pelvis was performed
following the standard protocol without IV contrast.

[Series 3: a/p w/o 5mm · axial · non-contrast · 0.90mm/px · z∈[+888,+1278]mm · 12 of 94 slices shown, 14 images]
[im 8/94  soft-tissue]
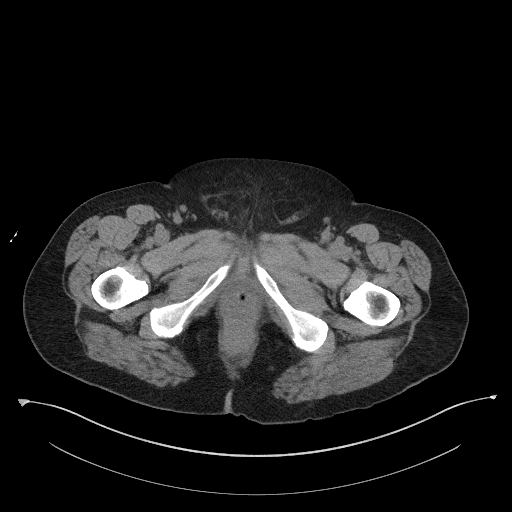
[im 8/94  bone]
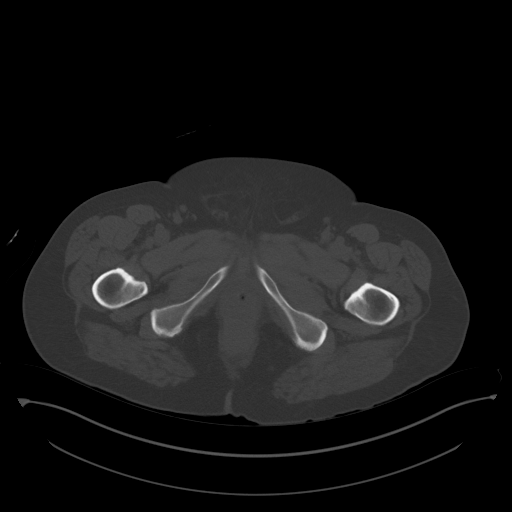
[im 15/94  soft-tissue]
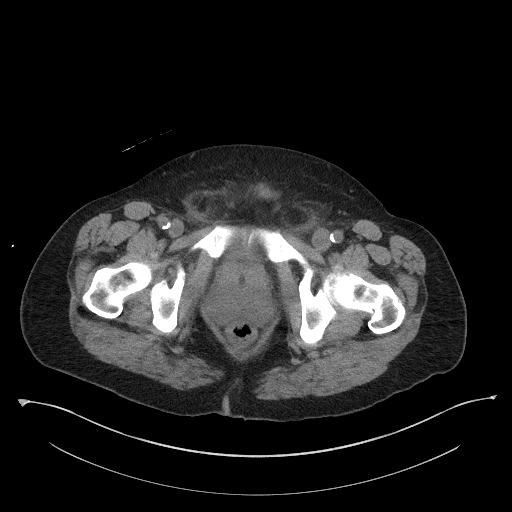
[im 22/94  soft-tissue]
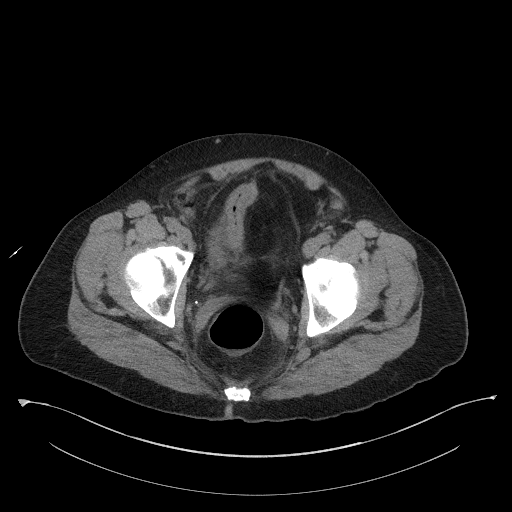
[im 29/94  soft-tissue]
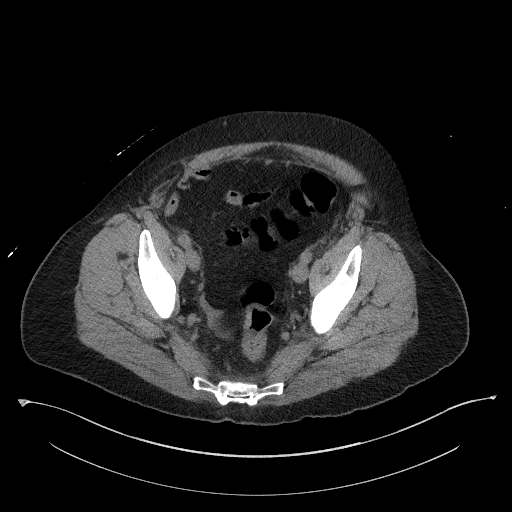
[im 36/94  soft-tissue]
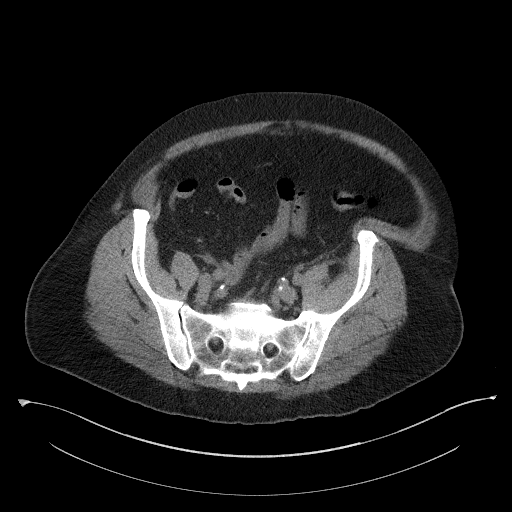
[im 43/94  soft-tissue]
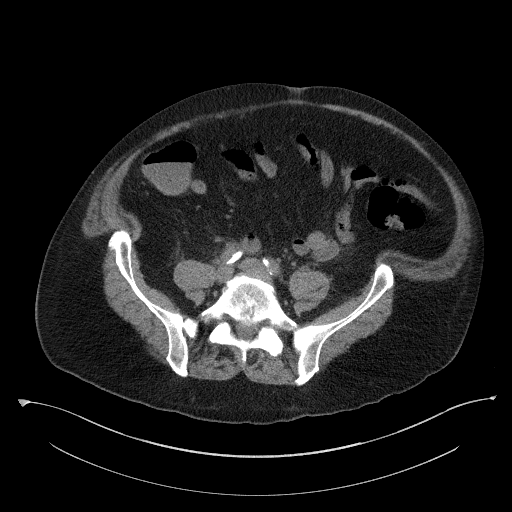
[im 51/94  soft-tissue]
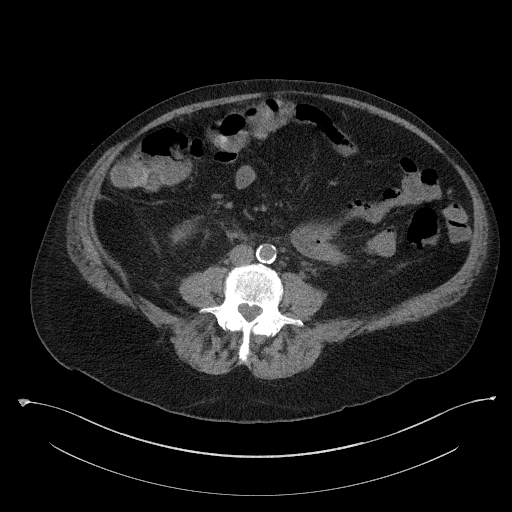
[im 58/94  soft-tissue]
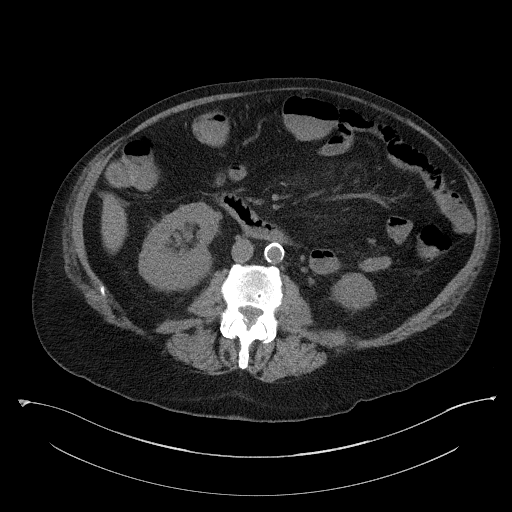
[im 65/94  soft-tissue]
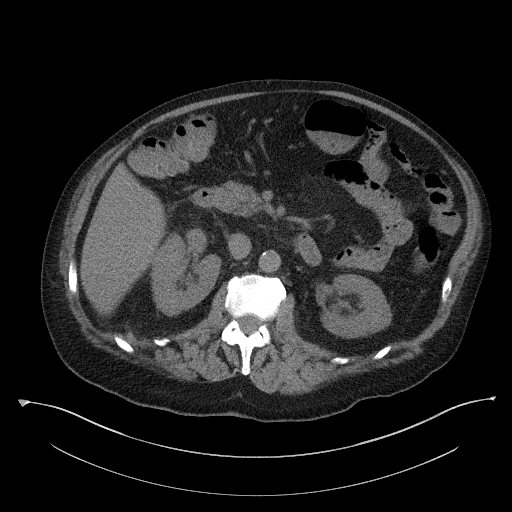
[im 65/94  bone]
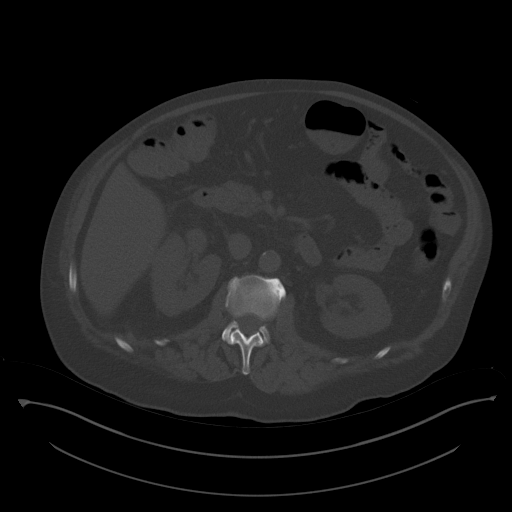
[im 72/94  soft-tissue]
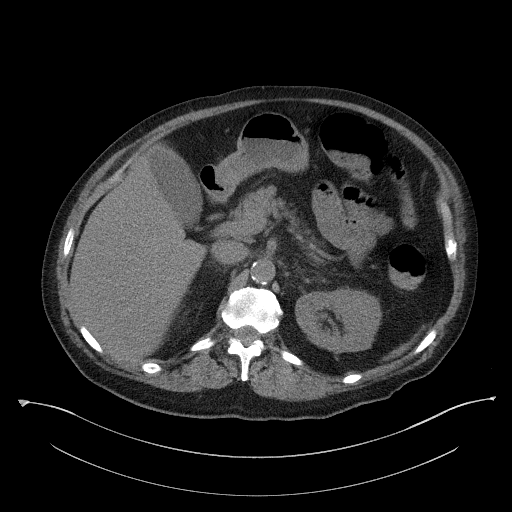
[im 79/94  soft-tissue]
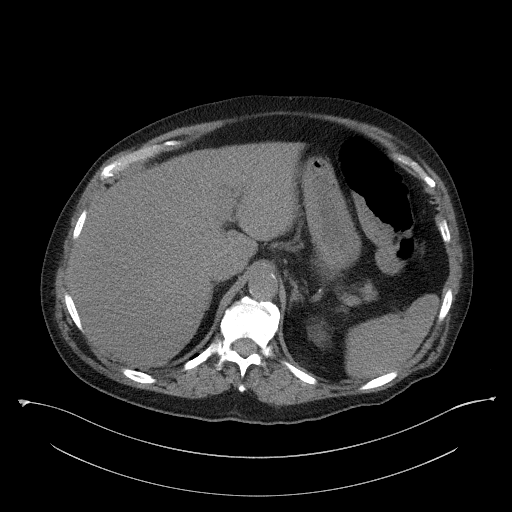
[im 86/94  soft-tissue]
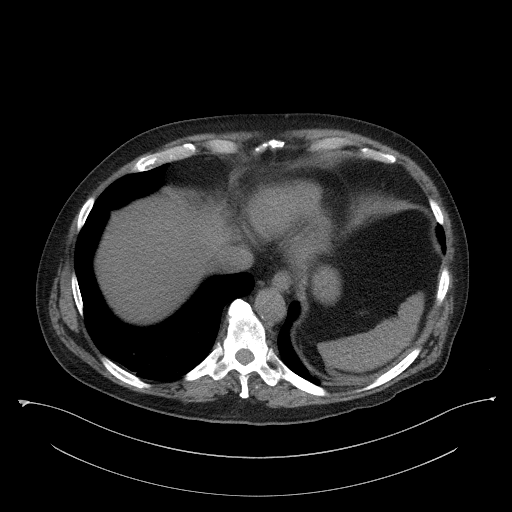

[Series 6: a/p w/o cor · coronal · non-contrast · 0.92mm/px · 3 of 176 slices shown]
[im 59/176  soft-tissue]
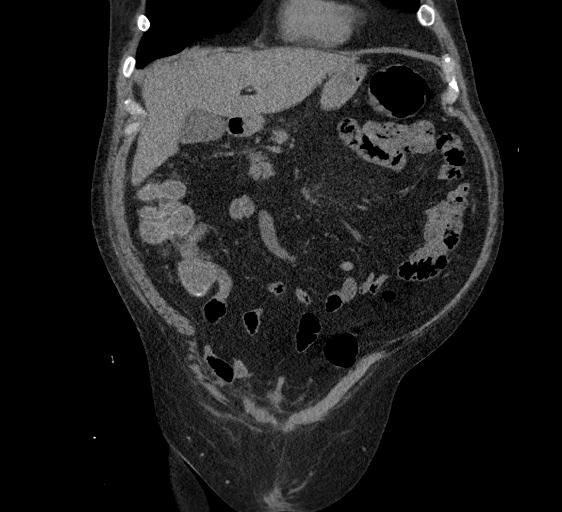
[im 78/176  soft-tissue]
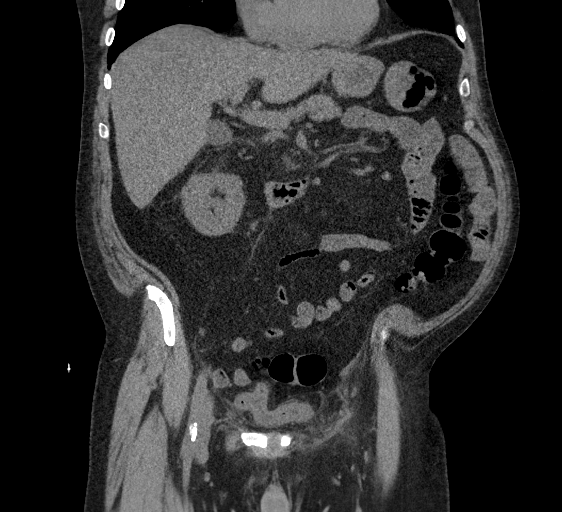
[im 98/176  soft-tissue]
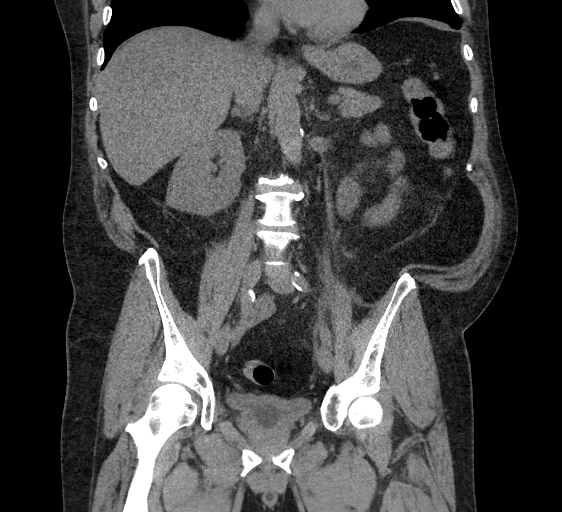

[15 of 46 positions shown; findings below may reference images not displayed]

FINDINGS: Lower chest: Bandlike areas of scarring and/or atelectasis. Lung
bases are otherwise clear. Normal heart size. No pericardial
effusion.

Hepatobiliary: No focal liver abnormality is seen. Stable appearance
of the 1 cm gallstone near the gallbladder neck. No gallbladder wall
thickening or biliary dilatation.

Pancreas: Unremarkable. No pancreatic ductal dilatation or
surrounding inflammatory changes.

Spleen: Normal in size without focal abnormality.

Adrenals/Urinary Tract: Normal adrenals. Asymmetrically enlarged
appearance of the right kidney with increased right perinephric
stranding. Several nonobstructing calculi are present in the right
renal collecting system. No ureteral calculi. The left kidney has a
more normal appearance. Left ureter is unremarkable.

The circumferentially thickened urinary bladder is largely
decompressed over an inflated Foley catheter balloon with foci of
gas within the lumen likely related to this instrumentation.

Stomach/Bowel: Distal esophagus, stomach and duodenal sweep are
unremarkable. No bowel wall thickening or dilatation. No evidence of
obstruction. A normal appendix is visualized.

Vascular/Lymphatic: Atherosclerotic plaque within the normal caliber
aorta. Greater than expected number of upper mid mesenteric lymph
nodes in a surrounding area of hazy mesenteric stranding compatible
with a ?misty mesentery? similar to prior. No suspicious or enlarged
lymph nodes in the included lymphatic chains.

Reproductive: Mild prostatomegaly. Coarse eccentric calcification of
the prostate. No concerning abnormalities of the prostate or seminal
vesicles.

Other: No abdominopelvic free fluid or free gas. No bowel containing
hernias. Bilateral fat containing inguinal hernias.

Musculoskeletal: No acute or significant osseous findings.
Multilevel degenerative changes are present in the imaged portions
of the spine.
IMPRESSION: 1. Asymmetrically enlarged appearance of the right kidney with
increased right perinephric stranding, concerning for
pyelonephritis. Recommend correlation with urinalysis.
2. Several nonobstructing calculi in the right renal collecting
system.
3. Circumferentially thickened bladder wall decompressed by Foley
catheter. Thickening may be treatment related the exclusion of
urinary tract infection is recommended.
4. Greater than expected number of upper mid mesenteric lymph nodes
in a surrounding area of hazy mesenteric stranding compatible with
mesenteritis, similar to prior.
5. Cholelithiasis without evidence of acute cholecystitis.
6. Mild prostatomegaly.
7. Aortic Atherosclerosis ([id number].E).

## 2021-08-20 IMAGING — DX DG CHEST 1V PORT
1 series · 1 of 1 positions shown · non-contrast
Comparison: 11/10/2013

CLINICAL DATA: Cough

EXAM:
PORTABLE CHEST 1 VIEW

[chest ap]
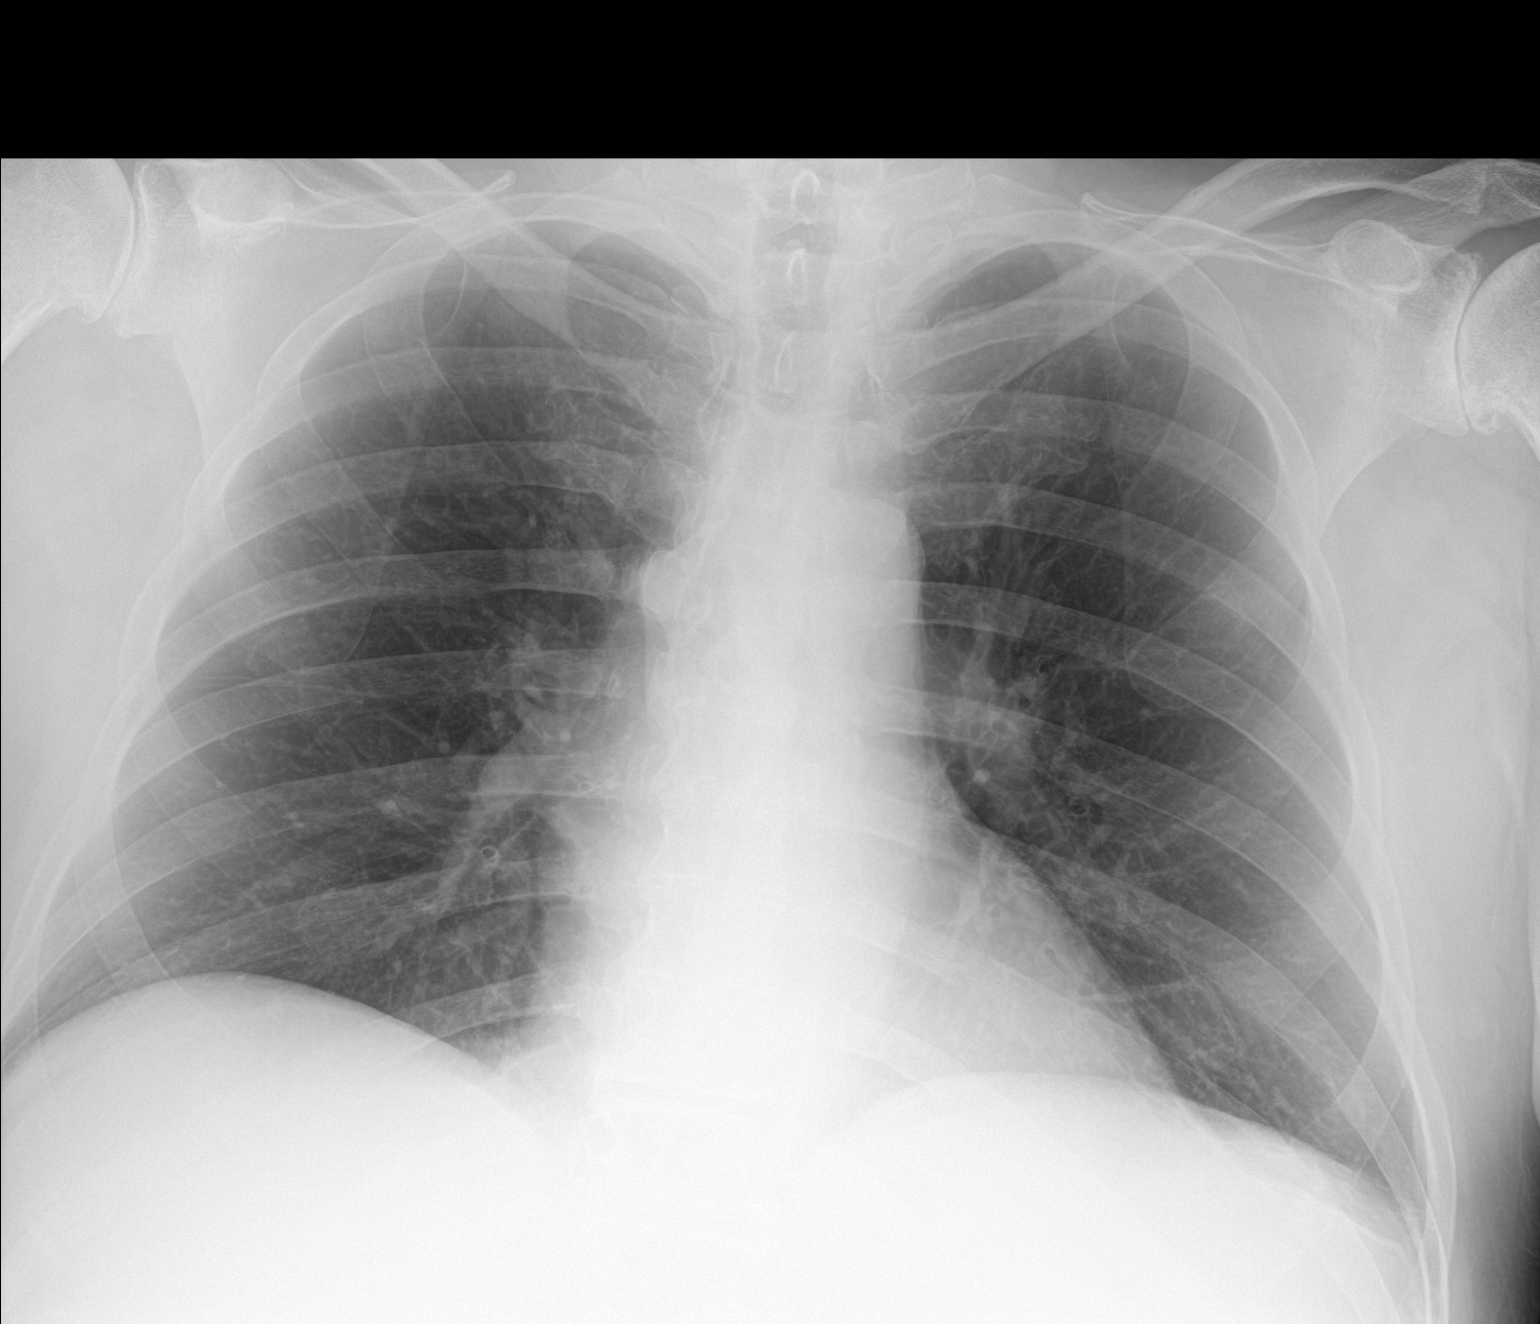

[1 of 1 positions shown; findings below may reference images not displayed]

FINDINGS: Normal heart size and mediastinal contours. Minimal scarring or
atelectasis overlapping the diaphragm. No acute infiltrate or edema.
No effusion or pneumothorax. Bilateral glenohumeral osteoarthritis.
No acute osseous findings.
IMPRESSION: No active disease.

## 2023-05-31 DIAGNOSIS — Z01818 Encounter for other preprocedural examination: Secondary | ICD-10-CM | POA: Diagnosis not present
